# Patient Record
Sex: Female | Born: 1995 | Race: Black or African American | Hispanic: No | Marital: Married | Smoking: Never smoker
Health system: Southern US, Community
[De-identification: ages and names within clinical notes are randomized; demographics above are authoritative.]

## PROBLEM LIST (undated history)

## (undated) ENCOUNTER — Emergency Department (HOSPITAL_COMMUNITY): Admission: EM | Payer: BC Managed Care – PPO | Source: Home / Self Care

## (undated) ENCOUNTER — Inpatient Hospital Stay (HOSPITAL_COMMUNITY): Payer: Self-pay

## (undated) DIAGNOSIS — J45909 Unspecified asthma, uncomplicated: Secondary | ICD-10-CM

## (undated) DIAGNOSIS — I10 Essential (primary) hypertension: Secondary | ICD-10-CM

## (undated) DIAGNOSIS — R87629 Unspecified abnormal cytological findings in specimens from vagina: Secondary | ICD-10-CM

## (undated) DIAGNOSIS — Z8619 Personal history of other infectious and parasitic diseases: Secondary | ICD-10-CM

## (undated) DIAGNOSIS — Z789 Other specified health status: Secondary | ICD-10-CM

## (undated) HISTORY — DX: Unspecified abnormal cytological findings in specimens from vagina: R87.629

## (undated) HISTORY — DX: Personal history of other infectious and parasitic diseases: Z86.19

## (undated) HISTORY — PX: NO PAST SURGERIES: SHX2092

---
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
## 2015-02-17 ENCOUNTER — Inpatient Hospital Stay (HOSPITAL_COMMUNITY)
Admission: AD | Admit: 2015-02-17 | Discharge: 2015-02-17 | Disposition: A | Discharge status: Home / Self Care | Payer: Medicaid Other | Source: Ambulatory Visit | Attending: Obstetrics & Gynecology | Admitting: Obstetrics & Gynecology

## 2015-02-17 ENCOUNTER — Encounter (HOSPITAL_COMMUNITY): Payer: Self-pay | Admitting: *Deleted

## 2015-02-17 DIAGNOSIS — O98819 Other maternal infectious and parasitic diseases complicating pregnancy, unspecified trimester: Secondary | ICD-10-CM

## 2015-02-17 DIAGNOSIS — O2342 Unspecified infection of urinary tract in pregnancy, second trimester: Secondary | ICD-10-CM | POA: Insufficient documentation

## 2015-02-17 DIAGNOSIS — Z3A17 17 weeks gestation of pregnancy: Secondary | ICD-10-CM | POA: Insufficient documentation

## 2015-02-17 DIAGNOSIS — O208 Other hemorrhage in early pregnancy: Secondary | ICD-10-CM | POA: Diagnosis present

## 2015-02-17 DIAGNOSIS — O23592 Infection of other part of genital tract in pregnancy, second trimester: Secondary | ICD-10-CM | POA: Insufficient documentation

## 2015-02-17 DIAGNOSIS — O0932 Supervision of pregnancy with insufficient antenatal care, second trimester: Secondary | ICD-10-CM

## 2015-02-17 DIAGNOSIS — B9689 Other specified bacterial agents as the cause of diseases classified elsewhere: Secondary | ICD-10-CM | POA: Diagnosis not present

## 2015-02-17 DIAGNOSIS — O2242 Hemorrhoids in pregnancy, second trimester: Secondary | ICD-10-CM | POA: Diagnosis not present

## 2015-02-17 DIAGNOSIS — N760 Acute vaginitis: Secondary | ICD-10-CM | POA: Diagnosis not present

## 2015-02-17 DIAGNOSIS — R8271 Bacteriuria: Secondary | ICD-10-CM

## 2015-02-17 DIAGNOSIS — A749 Chlamydial infection, unspecified: Secondary | ICD-10-CM

## 2015-02-17 LAB — URINALYSIS, ROUTINE W REFLEX MICROSCOPIC
Bilirubin Urine: NEGATIVE
Glucose, UA: NEGATIVE mg/dL
Hgb urine dipstick: NEGATIVE
Ketones, ur: NEGATIVE mg/dL
Nitrite: NEGATIVE
Protein, ur: NEGATIVE mg/dL
SPECIFIC GRAVITY, URINE: 1.015 (ref 1.005–1.030)
UROBILINOGEN UA: 1 mg/dL (ref 0.0–1.0)
pH: 8.5 — ABNORMAL HIGH (ref 5.0–8.0)

## 2015-02-17 LAB — WET PREP, GENITAL
Trich, Wet Prep: NONE SEEN
YEAST WET PREP: NONE SEEN

## 2015-02-17 LAB — URINE MICROSCOPIC-ADD ON

## 2015-02-17 MED ORDER — HYDROCORTISONE ACE-PRAMOXINE 1-1 % RE FOAM: 1.0000 | Freq: Two times a day (BID) | RECTAL | Status: DC

## 2015-02-17 MED ORDER — METRONIDAZOLE 500 MG PO TABS: 500.0000 mg | ORAL_TABLET | Freq: Two times a day (BID) | ORAL | Status: DC

## 2015-02-17 NOTE — MAU Provider Note (Signed)
History     CSN: [id number]  Arrival date and time: 02/17/15 4098   First Provider Initiated Contact with Patient 02/17/15 1859      Chief Complaint  Patient presents with  . Rectal Bleeding   HPI  Ms. Michelle Calhoun is a 19 y.o. G1P0 at [redacted]w[redacted]d here with report of rectal bleeding when having a bowel movement today.  Reports blood in toilet.  Also difficult to sit on bottom due to pain.  Last normal bowel movement two days ago.  Denies vaginal bleeding or abdominal pain.  Has not started prenatal care due to [facility] pending.    History reviewed. No pertinent past medical history.  History reviewed. No pertinent past surgical history.  History reviewed. No pertinent family history.  Social History  Substance Use Topics  . Smoking status: Never Smoker   . Smokeless tobacco: None  . Alcohol Use: No     Comment: denies 08/28/14    Allergies: No Known Allergies  Prescriptions prior to admission  Medication Sig Dispense Refill Last Dose  . diphenhydrAMINE (BENADRYL) 25 MG tablet Take 25 mg by mouth every 6 (six) hours as needed for allergies or sleep.   Past Week at Unknown time  . Prenatal Vit-Fe Fumarate-FA (PRENATAL MULTIVITAMIN) TABS tablet Take 1 tablet by mouth daily at 12 noon.   Past Week at Unknown time    Review of Systems  Constitutional: Negative for fever and chills.  Gastrointestinal: Positive for blood in stool. Negative for nausea, vomiting and abdominal pain.  Genitourinary: Negative for dysuria, urgency and frequency.  Musculoskeletal: Negative for back pain.  All other systems reviewed and are negative.  Physical Exam   Blood pressure 104/46, pulse 88, temperature 98.4 F (36.9 C), temperature source Oral, resp. rate 18, height 5\' 4"  (1.626 m), weight 128 lb 6.4 oz (58.242 kg), last menstrual period 10/19/2014.  Physical Exam  Constitutional: She is oriented to person, place, and time. She appears well-developed and well-nourished.  HENT:  Head:  Normocephalic.  Neck: Normal range of motion. Neck supple.  Cardiovascular: Normal rate, regular rhythm and normal heart sounds.   Respiratory: Effort normal and breath sounds normal. No respiratory distress.  GI: Soft. There is no tenderness.  Fundal height 17  Genitourinary: Rectal exam shows internal hemorrhoid. Rectal exam shows no external hemorrhoid. Cervix exhibits discharge (yellowish discharge in cervical os). Cervix exhibits no motion tenderness. No bleeding in the vagina. Vaginal discharge (mucusy) found.  Musculoskeletal: Normal range of motion. She exhibits no edema.  Neurological: She is alert and oriented to person, place, and time.  Skin: Skin is warm and dry.    MAU Course  Procedures Results for orders placed or performed during the hospital encounter of 02/17/15 (from the past 24 hour(s))  Urinalysis, Routine w reflex microscopic (not at [facility])     Status: Abnormal   Collection Time: 02/17/15  5:53 PM  Result Value Ref Range   Color, Urine YELLOW YELLOW   APPearance CLEAR CLEAR   Specific Gravity, Urine 1.015 1.005 - 1.030   pH 8.5 (H) 5.0 - 8.0   Glucose, UA NEGATIVE NEGATIVE mg/dL   Hgb urine dipstick NEGATIVE NEGATIVE   Bilirubin Urine NEGATIVE NEGATIVE   Ketones, ur NEGATIVE NEGATIVE mg/dL   Protein, ur NEGATIVE NEGATIVE mg/dL   Urobilinogen, UA 1.0 0.0 - 1.0 mg/dL   Nitrite NEGATIVE NEGATIVE   Leukocytes, UA MODERATE (A) NEGATIVE  Urine microscopic-add on     Status: Abnormal   Collection Time: 02/17/15  5:53 PM  Result Value Ref Range   Squamous Epithelial / LPF FEW (A) RARE   WBC, UA 3-6 <3 WBC/hpf  Wet prep, genital     Status: Abnormal   Collection Time: 02/17/15  7:10 PM  Result Value Ref Range   Yeast Wet Prep HPF POC NONE SEEN NONE SEEN   Trich, Wet Prep NONE SEEN NONE SEEN   Clue Cells Wet Prep HPF POC FEW (A) NONE SEEN   WBC, Wet Prep HPF POC FEW (A) NONE SEEN    Assessment and Plan  19 y.o. G1P0 at [redacted]w[redacted]d IUP Bacterial Vaginosis Bacteria  in Urine Hemorrhoids No prenatal care  Plan: Discharge to home RX Flagyl 500 mg BID x 7 days GC/CT pending Urine culture to lab RX proctofoam Outpatient anatomy ultrasound ordered Message routed to [facility] clinic for new OB appt  Marlis Edelson 02/17/2015, 8:15 PM

## 2015-02-17 NOTE — MAU Note (Signed)
Pt stated she had a BM today and then saw a lot of blood when she wiped. Did strain to have the BM

## 2015-02-18 LAB — GC/CHLAMYDIA PROBE AMP (~~LOC~~) NOT AT ARMC
Chlamydia: POSITIVE — AB
Neisseria Gonorrhea: NEGATIVE

## 2015-02-19 LAB — URINE CULTURE

## 2015-02-21 ENCOUNTER — Telehealth (HOSPITAL_COMMUNITY): Payer: Self-pay | Admitting: *Deleted

## 2015-02-21 DIAGNOSIS — A749 Chlamydial infection, unspecified: Secondary | ICD-10-CM

## 2015-02-21 DIAGNOSIS — O98812 Other maternal infectious and parasitic diseases complicating pregnancy, second trimester: Principal | ICD-10-CM

## 2015-02-21 MED ORDER — AZITHROMYCIN 500 MG PO TABS: ORAL_TABLET | ORAL | Status: DC

## 2015-02-21 NOTE — Telephone Encounter (Signed)
Telephone call to patient regarding positive chlamydia culture, patient notified.  Rx routed to pharmacy per protocol.  Instructed patient to notify her partner for treatment and to abstain from sex for seven days post treatment.  Report faxed to health department.  

## 2015-03-01 ENCOUNTER — Encounter: Payer: Self-pay | Admitting: Advanced Practice Midwife

## 2015-03-01 ENCOUNTER — Encounter: Payer: Medicaid Other | Admitting: Advanced Practice Midwife

## 2015-03-14 LAB — OB RESULTS CONSOLE ANTIBODY SCREEN: ANTIBODY SCREEN: NEGATIVE

## 2015-03-14 LAB — OB RESULTS CONSOLE GC/CHLAMYDIA
CHLAMYDIA, DNA PROBE: POSITIVE
GC PROBE AMP, GENITAL: NEGATIVE

## 2015-03-14 LAB — OB RESULTS CONSOLE RUBELLA ANTIBODY, IGM: Rubella: IMMUNE

## 2015-03-14 LAB — OB RESULTS CONSOLE HEPATITIS B SURFACE ANTIGEN: HEP B S AG: NEGATIVE

## 2015-03-14 LAB — OB RESULTS CONSOLE HIV ANTIBODY (ROUTINE TESTING): HIV: NONREACTIVE

## 2015-03-14 LAB — OB RESULTS CONSOLE ABO/RH: RH Type: POSITIVE

## 2015-03-14 LAB — OB RESULTS CONSOLE RPR: RPR: NONREACTIVE

## 2015-03-21 ENCOUNTER — Other Ambulatory Visit (HOSPITAL_COMMUNITY): Payer: Self-pay | Admitting: Family

## 2015-03-21 ENCOUNTER — Ambulatory Visit (HOSPITAL_COMMUNITY)
Admission: RE | Admit: 2015-03-21 | Discharge: 2015-03-21 | Disposition: A | Discharge status: Home / Self Care | Payer: Medicaid Other | Source: Ambulatory Visit | Attending: Nurse Practitioner | Admitting: Nurse Practitioner

## 2015-03-21 DIAGNOSIS — O0932 Supervision of pregnancy with insufficient antenatal care, second trimester: Secondary | ICD-10-CM

## 2015-03-21 DIAGNOSIS — Z3A20 20 weeks gestation of pregnancy: Secondary | ICD-10-CM | POA: Insufficient documentation

## 2015-03-21 DIAGNOSIS — Z3A21 21 weeks gestation of pregnancy: Secondary | ICD-10-CM

## 2015-03-21 DIAGNOSIS — Z3689 Encounter for other specified antenatal screening: Secondary | ICD-10-CM

## 2015-03-21 DIAGNOSIS — Z36 Encounter for antenatal screening of mother: Secondary | ICD-10-CM | POA: Diagnosis present

## 2015-03-28 ENCOUNTER — Encounter (HOSPITAL_COMMUNITY): Payer: Self-pay

## 2015-03-28 ENCOUNTER — Other Ambulatory Visit (HOSPITAL_COMMUNITY): Payer: Self-pay | Admitting: Nurse Practitioner

## 2015-03-28 ENCOUNTER — Ambulatory Visit (HOSPITAL_COMMUNITY)
Admission: RE | Admit: 2015-03-28 | Discharge: 2015-03-28 | Disposition: A | Discharge status: Home / Self Care | Payer: Medicaid Other | Source: Ambulatory Visit | Attending: Nurse Practitioner | Admitting: Nurse Practitioner

## 2015-03-28 DIAGNOSIS — O281 Abnormal biochemical finding on antenatal screening of mother: Secondary | ICD-10-CM

## 2015-03-28 DIAGNOSIS — A749 Chlamydial infection, unspecified: Secondary | ICD-10-CM

## 2015-03-28 DIAGNOSIS — O280 Abnormal hematological finding on antenatal screening of mother: Secondary | ICD-10-CM | POA: Insufficient documentation

## 2015-03-28 DIAGNOSIS — O98819 Other maternal infectious and parasitic diseases complicating pregnancy, unspecified trimester: Secondary | ICD-10-CM

## 2015-03-28 NOTE — Progress Notes (Signed)
Genetic Counseling  High-Risk Gestation Note  Appointment Date:  03/28/2015 Referred By: [name], NP Date of Birth:  May 29, 1996 Partner:  [name]    Pregnancy History: G1P0 Estimated Date of Delivery: 08/08/15 Estimated Gestational Age: [redacted]w[redacted]d Attending: [name], MD    Michelle Calhoun and her partner, Mr. [name], were seen for genetic counseling because of an increased risk for fetal Down syndrome based on Quad screen through [facility].  In Summary:  1 in 8 Down syndrome risk from Quad  Targeted ultrasound performed 03/21/15, within normal limits. Reviewed limitations of ultrasound  Couple declined NIPS and amniocentesis  Follow-up ultrasound scheduled for 05/09/15 for fetal growth assessment, given elevated DIA on Quad screen  Family history significant for maternal great aunt with sickle cell disease. See detailed discussion below.   They were counseled regarding the Quad screen result and the associated 1 in 8 risk for fetal Down syndrome.  We reviewed chromosomes, nondisjunction, and the common features and variable prognosis of Down syndrome.  In addition, we reviewed the screen negative risks for trisomy 18 and ONTDs.  We also discussed other explanations for a screen positive result including: a gestational dating error, differences in maternal metabolism, and normal variation. They understand that this screening is not diagnostic for Down syndrome but provides a risk assessment. Additionally, we specifically discussed that the level of one of the proteins analyzed on the screen, DIA, was very high (2.22 MoM).  This has been associated with an increased risk for growth restriction or poor pregnancy outcome later in pregnancy; therefore, we would recommend a follow up ultrasound for fetal growth in the third trimester.  We reviewed available screening options including noninvasive prenatal screening (NIPS)/cell  free DNA (cfDNA) testing and detailed ultrasound.  We reviewed that ultrasound performed in [facility] Radiology department on 03/21/15 was within normal range. Complete ultrasound results were reported under separate cover. They were counseled that screening tests are used to modify a patient's a priori risk for aneuploidy, typically based on age. This estimate provides a pregnancy specific risk assessment. We reviewed the benefits and limitations of each option. Specifically, we discussed the conditions for which each test screens, the detection rates, and false positive rates of each. They were also counseled regarding diagnostic testing via CVS/amniocentesis. We reviewed the approximate 1 in 300-500 risk for complications for amniocentesis, including spontaneous pregnancy loss.   After consideration of all the options, they declined NIPS and amniocentesis, stating that knowing about the presence of a chromosome condition prior to delivery would likely cause additional stress. They understand that screening tests cannot rule out all birth defects or genetic syndromes. The patient was advised of this limitation and states she still does not want additional testing at this time.   Both family histories were reviewed and found to be contributory for a maternal great-aunt to the patient who had sickle cell anemia. No additional relatives were known with sickle cell anemia or sickle cell trait. Michelle Calhoun was provided with written information regarding sickle cell anemia (SCA) including the carrier frequency and incidence in the African-American population, the availability of carrier testing and prenatal diagnosis if indicated.  In addition, we discussed that hemoglobinopathies are routinely screened for as part of the [place] newborn screening panel. Given this reported family history, Ms. ,Cueva's chance to have sickle cell trait, prior to carrier screening, would be approximately 1 in 6.  We  discussed that available OB  medical records indicate that hemoglobin electrophoresis was drawn for the patient on 03/14/15, and that results are not available to [place] at the time of today's visit. Without further information regarding the provided family history, an accurate genetic risk cannot be calculated. Further genetic counseling is warranted if more information is obtained.  Michelle Calhoun denied exposure to environmental toxins or chemical agents. She denied the use of alcohol, tobacco or street drugs. She denied significant viral illnesses during the course of her pregnancy. Her medical and surgical histories were noncontributory.   I counseled this couple for approximately 40 minutes regarding the above risks and available options.   [name], MS,  Certified Genetic Counselor 03/28/2015

## 2015-05-09 ENCOUNTER — Ambulatory Visit (HOSPITAL_COMMUNITY)
Admission: RE | Admit: 2015-05-09 | Discharge: 2015-05-09 | Disposition: A | Discharge status: Home / Self Care | Payer: Medicaid Other | Source: Ambulatory Visit | Attending: Nurse Practitioner | Admitting: Nurse Practitioner

## 2015-05-09 ENCOUNTER — Encounter (HOSPITAL_COMMUNITY): Payer: Self-pay

## 2015-05-09 ENCOUNTER — Other Ambulatory Visit (HOSPITAL_COMMUNITY): Payer: Self-pay | Admitting: Maternal and Fetal Medicine

## 2015-05-09 DIAGNOSIS — O283 Abnormal ultrasonic finding on antenatal screening of mother: Secondary | ICD-10-CM | POA: Diagnosis not present

## 2015-05-09 DIAGNOSIS — IMO0002 Reserved for concepts with insufficient information to code with codable children: Secondary | ICD-10-CM

## 2015-05-09 DIAGNOSIS — O281 Abnormal biochemical finding on antenatal screening of mother: Secondary | ICD-10-CM

## 2015-05-09 DIAGNOSIS — Z3A27 27 weeks gestation of pregnancy: Secondary | ICD-10-CM

## 2015-05-09 DIAGNOSIS — O0932 Supervision of pregnancy with insufficient antenatal care, second trimester: Secondary | ICD-10-CM

## 2015-05-20 ENCOUNTER — Encounter (HOSPITAL_COMMUNITY): Payer: Self-pay

## 2015-05-20 ENCOUNTER — Inpatient Hospital Stay (HOSPITAL_COMMUNITY)
Admission: AD | Admit: 2015-05-20 | Discharge: 2015-05-20 | Disposition: A | Discharge status: Home / Self Care | Payer: Medicaid Other | Source: Ambulatory Visit | Attending: Family Medicine | Admitting: Family Medicine

## 2015-05-20 DIAGNOSIS — O99613 Diseases of the digestive system complicating pregnancy, third trimester: Secondary | ICD-10-CM | POA: Insufficient documentation

## 2015-05-20 DIAGNOSIS — O26893 Other specified pregnancy related conditions, third trimester: Secondary | ICD-10-CM | POA: Diagnosis not present

## 2015-05-20 DIAGNOSIS — R109 Unspecified abdominal pain: Secondary | ICD-10-CM | POA: Diagnosis present

## 2015-05-20 DIAGNOSIS — Z3A28 28 weeks gestation of pregnancy: Secondary | ICD-10-CM | POA: Insufficient documentation

## 2015-05-20 DIAGNOSIS — K219 Gastro-esophageal reflux disease without esophagitis: Secondary | ICD-10-CM | POA: Diagnosis not present

## 2015-05-20 HISTORY — DX: Other specified health status: Z78.9

## 2015-05-20 HISTORY — DX: Unspecified asthma, uncomplicated: J45.909

## 2015-05-20 MED ORDER — DOCUSATE SODIUM 100 MG PO CAPS: 100.0000 mg | ORAL_CAPSULE | Freq: Two times a day (BID) | ORAL | Status: DC

## 2015-05-20 MED ORDER — GI COCKTAIL ~~LOC~~
30.0000 mL | Freq: Once | ORAL | Status: AC
  Administered 2015-05-20: 30 mL via ORAL
  Filled 2015-05-20: qty 30

## 2015-05-20 MED ORDER — OMEPRAZOLE 20 MG PO CPDR: 20.0000 mg | DELAYED_RELEASE_CAPSULE | Freq: Every day | ORAL | Status: DC

## 2015-05-20 NOTE — MAU Provider Note (Signed)
First Provider Initiated Contact with Patient 05/20/15 [phone]      Chief Complaint:  Heartburn and Abdominal Pain   Michelle Calhoun is  19 y.o. G1P0 at [redacted]w[redacted]d presents complaining of Heartburn and Abdominal Pain .  She states none contractions are associated with none vaginal bleeding, intact membranes, along with active fetal movement.  Pt. Is here with acid reflux that has been going on since eating meatballs at lunch today. She says that she had one episode of vomiting earlier today that consisted of her meatballs. She denies flank pain, dysuria, hematuria, fever, or chills. She has not had vision changes, or headaches. She has no abdominal pain. She denies vaginal bleeding, or leaking fluids. She says she has a "couple of contractions" per night. She says the baby is moving very well. She has not been taking the acid reflux medicine recommended at her previous ED visit. She is followed at [facility] for her pregnancy and has a visit next week. She is also being followed by MFM for increased risk of Trisomy 21 by Quad screen.    Obstetrical/Gynecological History: OB History    Gravida Para Term Preterm AB TAB SAB Ectopic Multiple Living   1              Past Medical History: Past Medical History  Diagnosis Date  . Asthma   . Medical history non-contributory     Past Surgical History: Past Surgical History  Procedure Laterality Date  . No past surgeries      Family History: No family history on file.  Social History: Social History  Substance Use Topics  . Smoking status: Never Smoker   . Smokeless tobacco: None  . Alcohol Use: No     Comment: denies 08/28/14    Allergies: No Known Allergies  Meds:  Prescriptions prior to admission  Medication Sig Dispense Refill Last Dose  . azithromycin (ZITHROMAX) 500 MG tablet Take two tablets by mouth once (Patient not taking: Reported on 05/09/2015) 2 tablet 0 Not Taking  . diphenhydrAMINE (BENADRYL) 25 MG tablet Take 25 mg by mouth  every 6 (six) hours as needed for allergies or sleep.   Taking  . hydrocortisone-pramoxine (PROCTOFOAM HC) rectal foam Place 1 applicator rectally 2 (two) times daily. (Patient not taking: Reported on 05/09/2015) 10 g 0 Not Taking  . metroNIDAZOLE (FLAGYL) 500 MG tablet Take 1 tablet (500 mg total) by mouth 2 (two) times daily. (Patient not taking: Reported on 05/09/2015) 14 tablet 0 Not Taking  . Prenatal Vit-Fe Fumarate-FA (PRENATAL MULTIVITAMIN) TABS tablet Take 1 tablet by mouth daily at 12 noon.   Taking    Review of Systems -   Review of Systems  Constitutional: Negative for fever, chills, weight loss, malaise/fatigue and diaphoresis.  HENT: Negative for hearing loss, ear pain, nosebleeds, congestion, sore throat, neck pain, tinnitus and ear discharge.   Eyes: Negative for blurred vision, double vision, photophobia, pain, discharge and redness.  Respiratory: Negative for cough, hemoptysis, sputum production, shortness of breath, wheezing and stridor.   Cardiovascular: Negative for chest pain, palpitations, orthopnea,  leg swelling  Gastrointestinal: Negative for abdominal pain heartburn, nausea, vomiting, diarrhea, constipation, blood in stool Genitourinary: Negative for dysuria, urgency, frequency, hematuria and flank pain.  Musculoskeletal: Negative for myalgias, back pain, joint pain and falls.  Skin: Negative for itching and rash.  Neurological: Negative for dizziness, tingling, tremors, sensory change, speech change, focal weakness, seizures, loss of consciousness, weakness and headaches.  Endo/Heme/Allergies: Negative for environmental allergies and  polydipsia. Does not bruise/bleed easily.  Psychiatric/Behavioral: Negative for depression, suicidal ideas, hallucinations, memory loss and substance abuse. The patient is not nervous/anxious and does not have insomnia.      Physical Exam  Blood pressure 114/60, pulse 85, temperature 97.7 F (36.5 C), temperature source Oral, resp.  rate 18, height 5\' 4"  (1.626 m), weight 63.05 kg (139 lb), last menstrual period 10/19/2014, SpO2 99 %. GENERAL: Well-developed, well-nourished female in no acute distress.  LUNGS: Clear to auscultation bilaterally.  HEART: Regular rate and rhythm. ABDOMEN: Soft, nontender, nondistended, gravid.   EXTREMITIES: Nontender, no edema, 2+ distal pulses. DTR's 2+ CERVICAL EXAM: Not performed.  FHT:  Baseline rate 130 bpm   Variability moderate  Accelerations present   Decelerations none    Labs: No results found for this or any previous visit (from the past 24 hour(s)). Imaging Studies:  [place] [facility] Ob Follow Up  05/09/2015  OBSTETRICAL ULTRASOUND: This exam was performed within a [facility]. The OB US report was generated in the AS system, and faxed to the ordering physician.  This report is available in the [facility] PACS. See the [facility] Obstetric US report via the Image Link.   Assessment: Michelle Calhoun is  19 y.o. G1P0 at 727w4d presents with GERD precipitating and episode of vomiting. Afebrile, no other warning signs or symptoms. Uncontrolled Acid reflux at this time.   Plan: - GI cocktail.  - Recommend ppi upon discharge.  - Following up with her OB provider next week.  - MFM follow up as previously scheduled.  - Return precautions reviewed.   Caleb Melancon 11/18/20161:14 AM  I was present for the exam and agree with above. PTL precautions reviewed. GERD diet discussed.   [place] Karlynn Furrow, CNM 05/20/2015 2:06 AM

## 2015-05-20 NOTE — MAU Note (Signed)
Pt here with c/o heartburn all day today and abdominal pain for last couple of weeks; positive fetal movement reported. Denies any bleeding or leaking of fluid. Has had vomiting today and stools loose, "which is not normal for me--usually hard for me to go"

## 2015-05-20 NOTE — Discharge Instructions (Signed)
Food Choices for Gastroesophageal Reflux Disease, Adult °When you have gastroesophageal reflux disease (GERD), the foods you eat and your eating habits are very important. Choosing the right foods can help ease the discomfort of GERD. °WHAT GENERAL GUIDELINES DO I NEED TO FOLLOW? °· Choose fruits, vegetables, whole grains, low-fat dairy products, and low-fat meat, fish, and poultry. °· Limit fats such as oils, salad dressings, butter, nuts, and avocado. °· Keep a food diary to identify foods that cause symptoms. °· Avoid foods that cause reflux. These may be different for different people. °· Eat frequent small meals instead of three large meals each day. °· Eat your meals slowly, in a relaxed setting. °· Limit fried foods. °· Cook foods using methods other than frying. °· Avoid drinking alcohol. °· Avoid drinking large amounts of liquids with your meals. °· Avoid bending over or lying down until 2-3 hours after eating. °WHAT FOODS ARE NOT RECOMMENDED? °The following are some foods and drinks that may worsen your symptoms: °Vegetables °Tomatoes. Tomato juice. Tomato and spaghetti sauce. Chili peppers. Onion and garlic. Horseradish. °Fruits °Oranges, grapefruit, and lemon (fruit and juice). °Meats °High-fat meats, fish, and poultry. This includes hot dogs, ribs, ham, sausage, salami, and bacon. °Dairy °Whole milk and chocolate milk. Sour cream. Cream. Butter. Ice cream. Cream cheese.  °Beverages °Coffee and tea, with or without caffeine. Carbonated beverages or energy drinks. °Condiments °Hot sauce. Barbecue sauce.  °Sweets/Desserts °Chocolate and cocoa. Donuts. Peppermint and spearmint. °Fats and Oils °High-fat foods, including French fries and potato chips. °Other °Vinegar. Strong spices, such as black pepper, white pepper, red pepper, cayenne, curry powder, cloves, ginger, and chili powder. °The items listed above may not be a complete list of foods and beverages to avoid. Contact your dietitian for more  information. °  °This information is not intended to replace advice given to you by your health care provider. Make sure you discuss any questions you have with your health care provider. °  °Document Released: 06/18/2005 Document Revised: 07/09/2014 Document Reviewed: 04/22/2013 °Elsevier Interactive Patient Education ©2016 Elsevier Inc. ° °Gastroesophageal Reflux Disease, Adult °Normally, food travels down the esophagus and stays in the stomach to be digested. However, when a person has gastroesophageal reflux disease (GERD), food and stomach acid move back up into the esophagus. When this happens, the esophagus becomes sore and inflamed. Over time, GERD can create small holes (ulcers) in the lining of the esophagus.  °CAUSES °This condition is caused by a problem with the muscle between the esophagus and the stomach (lower esophageal sphincter, or LES). Normally, the LES muscle closes after food passes through the esophagus to the stomach. When the LES is weakened or abnormal, it does not close properly, and that allows food and stomach acid to go back up into the esophagus. The LES can be weakened by certain dietary substances, medicines, and medical conditions, including: °· Tobacco use. °· Pregnancy. °· Having a hiatal hernia. °· Heavy alcohol use. °· Certain foods and beverages, such as coffee, chocolate, onions, and peppermint. °RISK FACTORS °This condition is more likely to develop in: °· People who have an increased body weight. °· People who have connective tissue disorders. °· People who use NSAID medicines. °SYMPTOMS °Symptoms of this condition include: °· Heartburn. °· Difficult or painful swallowing. °· The feeling of having a lump in the throat. °· A bitter taste in the mouth. °· Bad breath. °· Having a large amount of saliva. °· Having an upset or bloated stomach. °· Belching. °· Chest pain. °·   Shortness of breath or wheezing. °· Ongoing (chronic) cough or a night-time cough. °· Wearing away of  tooth enamel. °· Weight loss. °Different conditions can cause chest pain. Make sure to see your health care provider if you experience chest pain. °DIAGNOSIS °Your health care provider will take a medical history and perform a physical exam. To determine if you have mild or severe GERD, your health care provider may also monitor how you respond to treatment. You may also have other tests, including: °· An endoscopy to examine your stomach and esophagus with a small camera. °· A test that measures the acidity level in your esophagus. °· A test that measures how much pressure is on your esophagus. °· A barium swallow or modified barium swallow to show the shape, size, and functioning of your esophagus. °TREATMENT °The goal of treatment is to help relieve your symptoms and to prevent complications. Treatment for this condition may vary depending on how severe your symptoms are. Your health care provider may recommend: °· Changes to your diet. °· Medicine. °· Surgery. °HOME CARE INSTRUCTIONS °Diet °· Follow a diet as recommended by your health care provider. This may involve avoiding foods and drinks such as: °¨ Coffee and tea (with or without caffeine). °¨ Drinks that contain alcohol. °¨ Energy drinks and sports drinks. °¨ Carbonated drinks or sodas. °¨ Chocolate and cocoa. °¨ Peppermint and mint flavorings. °¨ Garlic and onions. °¨ Horseradish. °¨ Spicy and acidic foods, including peppers, chili powder, curry powder, vinegar, hot sauces, and barbecue sauce. °¨ Citrus fruit juices and citrus fruits, such as oranges, lemons, and limes. °¨ Tomato-based foods, such as red sauce, chili, salsa, and pizza with red sauce. °¨ Fried and fatty foods, such as donuts, french fries, potato chips, and high-fat dressings. °¨ High-fat meats, such as hot dogs and fatty cuts of red and white meats, such as rib eye steak, sausage, ham, and bacon. °¨ High-fat dairy items, such as whole milk, butter, and cream cheese. °· Eat small,  frequent meals instead of large meals. °· Avoid drinking large amounts of liquid with your meals. °· Avoid eating meals during the 2-3 hours before bedtime. °· Avoid lying down right after you eat. °· Do not exercise right after you eat. ° General Instructions  °· Pay attention to any changes in your symptoms. °· Take over-the-counter and prescription medicines only as told by your health care provider. Do not take aspirin, ibuprofen, or other NSAIDs unless your health care provider told you to do so. °· Do not use any tobacco products, including cigarettes, chewing tobacco, and e-cigarettes. If you need help quitting, ask your health care provider. °· Wear loose-fitting clothing. Do not wear anything tight around your waist that causes pressure on your abdomen. °· Raise (elevate) the head of your bed 6 inches (15cm). °· Try to reduce your stress, such as with yoga or meditation. If you need help reducing stress, ask your health care provider. °· If you are overweight, reduce your weight to an amount that is healthy for you. Ask your health care provider for guidance about a safe weight loss goal. °· Keep all follow-up visits as told by your health care provider. This is important. °SEEK MEDICAL CARE IF: °· You have new symptoms. °· You have unexplained weight loss. °· You have difficulty swallowing, or it hurts to swallow. °· You have wheezing or a persistent cough. °· Your symptoms do not improve with treatment. °· You have a hoarse voice. °SEEK IMMEDIATE MEDICAL CARE IF: °· You have pain   in your arms, neck, jaw, teeth, or back. °· You feel sweaty, dizzy, or light-headed. °· You have chest pain or shortness of breath. °· You vomit and your vomit looks like blood or coffee grounds. °· You faint. °· Your stool is bloody or black. °· You cannot swallow, drink, or eat. °  °This information is not intended to replace advice given to you by your health care provider. Make sure you discuss any questions you have with  your health care provider. °  °Document Released: 03/28/2005 Document Revised: 03/09/2015 Document Reviewed: 10/13/2014 °Elsevier Interactive Patient Education ©2016 Elsevier Inc. ° °

## 2015-06-06 ENCOUNTER — Ambulatory Visit (HOSPITAL_COMMUNITY)
Admission: RE | Admit: 2015-06-06 | Discharge: 2015-06-06 | Disposition: A | Discharge status: Home / Self Care | Payer: Medicaid Other | Source: Ambulatory Visit | Attending: Advanced Practice Midwife | Admitting: Advanced Practice Midwife

## 2015-06-06 ENCOUNTER — Encounter (HOSPITAL_COMMUNITY): Payer: Self-pay

## 2015-06-06 ENCOUNTER — Other Ambulatory Visit (HOSPITAL_COMMUNITY): Payer: Self-pay | Admitting: Obstetrics and Gynecology

## 2015-06-06 VITALS — BP 120/66 | HR 82 | Wt 138.8 lb

## 2015-06-06 DIAGNOSIS — O0933 Supervision of pregnancy with insufficient antenatal care, third trimester: Secondary | ICD-10-CM | POA: Insufficient documentation

## 2015-06-06 DIAGNOSIS — Z3A31 31 weeks gestation of pregnancy: Secondary | ICD-10-CM

## 2015-06-06 DIAGNOSIS — O283 Abnormal ultrasonic finding on antenatal screening of mother: Secondary | ICD-10-CM | POA: Insufficient documentation

## 2015-06-06 DIAGNOSIS — O280 Abnormal hematological finding on antenatal screening of mother: Secondary | ICD-10-CM

## 2015-06-06 DIAGNOSIS — O281 Abnormal biochemical finding on antenatal screening of mother: Secondary | ICD-10-CM

## 2015-06-20 ENCOUNTER — Encounter (HOSPITAL_COMMUNITY): Payer: Self-pay | Admitting: *Deleted

## 2015-06-20 ENCOUNTER — Inpatient Hospital Stay (HOSPITAL_COMMUNITY)
Admission: AD | Admit: 2015-06-20 | Discharge: 2015-06-20 | Disposition: A | Discharge status: Home / Self Care | Payer: Medicaid Other | Source: Ambulatory Visit | Attending: Obstetrics & Gynecology | Admitting: Obstetrics & Gynecology

## 2015-06-20 DIAGNOSIS — O26893 Other specified pregnancy related conditions, third trimester: Secondary | ICD-10-CM | POA: Insufficient documentation

## 2015-06-20 DIAGNOSIS — O280 Abnormal hematological finding on antenatal screening of mother: Secondary | ICD-10-CM

## 2015-06-20 DIAGNOSIS — R109 Unspecified abdominal pain: Secondary | ICD-10-CM | POA: Diagnosis present

## 2015-06-20 DIAGNOSIS — R102 Pelvic and perineal pain: Secondary | ICD-10-CM | POA: Insufficient documentation

## 2015-06-20 DIAGNOSIS — O98819 Other maternal infectious and parasitic diseases complicating pregnancy, unspecified trimester: Secondary | ICD-10-CM

## 2015-06-20 DIAGNOSIS — Z3A33 33 weeks gestation of pregnancy: Secondary | ICD-10-CM | POA: Diagnosis not present

## 2015-06-20 DIAGNOSIS — A749 Chlamydial infection, unspecified: Secondary | ICD-10-CM

## 2015-06-20 LAB — URINALYSIS, ROUTINE W REFLEX MICROSCOPIC
Bilirubin Urine: NEGATIVE
Glucose, UA: NEGATIVE mg/dL
Hgb urine dipstick: NEGATIVE
KETONES UR: NEGATIVE mg/dL
NITRITE: NEGATIVE
PROTEIN: NEGATIVE mg/dL
Specific Gravity, Urine: 1.01 (ref 1.005–1.030)
pH: 7 (ref 5.0–8.0)

## 2015-06-20 LAB — URINE MICROSCOPIC-ADD ON: RBC / HPF: NONE SEEN RBC/hpf (ref 0–5)

## 2015-06-20 NOTE — MAU Note (Signed)
Pt reports pelvic pressure and abd pain x 4 days, worsening tonight. Denies bleeding or ROM

## 2015-06-20 NOTE — MAU Provider Note (Signed)
  History     CSN: [id number]  Arrival date and time: 06/20/15 0240   None     No chief complaint on file.  HPI  19 yo G1 at 33.0 weeks presents to the MAU for pelvic pressure and abdominal pain. Her pregnancy has been complicated by an increased risk for Down's Syndrome, for which she is followed by MFM. She is also late to prenatal care. Per patient, she has had bilateral groin pain for the past 4 days that is constant and worse when she rolls over in bed and is walking. She has tried half tab of tylenol, but this hasn't helped. She denies any contractions, VB, LOF, or decreased FM. She also denies fever, decreased appetite, nausea, vomiting, and constipation. She has had increased pelvic pressure as well. No change in vaginal discharge.     Past Medical History  Diagnosis Date  . Asthma   . Medical history non-contributory     Past Surgical History  Procedure Laterality Date  . No past surgeries      History reviewed. No pertinent family history.  Social History  Substance Use Topics  . Smoking status: Never Smoker   . Smokeless tobacco: None  . Alcohol Use: No     Comment: denies 08/28/14    Allergies: No Known Allergies  Prescriptions prior to admission  Medication Sig Dispense Refill Last Dose  . diphenhydrAMINE (BENADRYL) 25 MG tablet Take 25 mg by mouth every 6 (six) hours as needed for allergies or sleep.   Past Month at Unknown time  . docusate sodium (COLACE) 100 MG capsule Take 1 capsule (100 mg total) by mouth 2 (two) times daily. 20 capsule 0 06/19/2015 at Unknown time  . omeprazole (PRILOSEC) 20 MG capsule Take 1 capsule (20 mg total) by mouth daily. 30 capsule 0 06/19/2015 at Unknown time  . Prenatal Vit-Fe Fumarate-FA (PRENATAL MULTIVITAMIN) TABS tablet Take 1 tablet by mouth daily at 12 noon.   06/19/2015 at Unknown time  . terconazole (TERAZOL 7) 0.4 % vaginal cream Place 1 applicator vaginally at bedtime.   06/19/2015 at Unknown time  .  hydrocortisone-pramoxine (PROCTOFOAM HC) rectal foam Place 1 applicator rectally 2 (two) times daily. (Patient not taking: Reported on 05/09/2015) 10 g 0 Unknown at Unknown time    ROS Physical Exam   Blood pressure 120/70, pulse 84, temperature 97.6 F (36.4 C), temperature source Oral, resp. rate 16, height 5\' 4"  (1.626 m), weight 64.864 kg (143 lb), last menstrual period 10/19/2014, SpO2 100 %.  Physical Exam  Gen: Alert, cooperative, no acute distress Lungs: CTAB Cardiac: RRR, no murmur Abd: Soft, nontender, +BS, gravid.  Extremities: Non edematous Pelvic: No cervical motion tenderness; closed/long/high  FHTs: Cat 1, reactive, baseline 150 No contractions noted  MAU Course  Procedures   Assessment and Plan  19 yo G1 at 33.0w presents to MAU for pelvic pressure and abdominal pain. Rare contractions on the monitor, no VB or LOF, cervix is closed, so patient is not in labor. No fever or vaginal discharge, so doubt STD or vaginitis. Most likely round ligament pain. Recommend pelvic girdle and full dose tylenol. Patient to f/u with OB this week.   Michelle Calhoun 06/20/2015, 3:12 AM

## 2015-07-03 NOTE — L&D Delivery Note (Signed)
Delivery Note At 3:09 PM a viable female was delivered via  (Presentation: occiuput anterior).  APGAR: 2, 6, 8; weight pending.   Placenta status: delivered, intact.  Cord: 3vc. with the following complications: Code Apgar.  Cord pH: 7.10  Anesthesia: Epidural  Episiotomy:  none Lacerations:  1st degree perineal, left labial Suture Repair: 3.0 vicryl Est. Blood Loss (mL):  150  Mom to postpartum.  Baby to NICU.  STINSON, JACOB JEHIEL 08/15/2015, 3:30 PM  After patient brought up to NICU, baby's breathing improved and baby returned to mother.  Levie Heritage, DO 08/15/2015 5:19 PM

## 2015-07-11 ENCOUNTER — Ambulatory Visit (HOSPITAL_COMMUNITY): Payer: Medicaid Other

## 2015-07-11 LAB — OB RESULTS CONSOLE GC/CHLAMYDIA
CHLAMYDIA, DNA PROBE: NEGATIVE
GC PROBE AMP, GENITAL: NEGATIVE

## 2015-07-11 LAB — OB RESULTS CONSOLE GBS: STREP GROUP B AG: POSITIVE

## 2015-07-13 ENCOUNTER — Ambulatory Visit (HOSPITAL_COMMUNITY)
Admission: RE | Admit: 2015-07-13 | Discharge: 2015-07-13 | Disposition: A | Discharge status: Home / Self Care | Payer: Medicaid Other | Source: Ambulatory Visit | Attending: Maternal and Fetal Medicine | Admitting: Maternal and Fetal Medicine

## 2015-07-13 ENCOUNTER — Other Ambulatory Visit (HOSPITAL_COMMUNITY): Payer: Self-pay | Admitting: Maternal and Fetal Medicine

## 2015-07-13 DIAGNOSIS — O280 Abnormal hematological finding on antenatal screening of mother: Secondary | ICD-10-CM

## 2015-07-13 DIAGNOSIS — IMO0002 Reserved for concepts with insufficient information to code with codable children: Secondary | ICD-10-CM

## 2015-07-13 DIAGNOSIS — Z3A36 36 weeks gestation of pregnancy: Secondary | ICD-10-CM

## 2015-07-13 DIAGNOSIS — O0933 Supervision of pregnancy with insufficient antenatal care, third trimester: Secondary | ICD-10-CM | POA: Diagnosis not present

## 2015-07-13 DIAGNOSIS — O283 Abnormal ultrasonic finding on antenatal screening of mother: Secondary | ICD-10-CM | POA: Insufficient documentation

## 2015-08-09 ENCOUNTER — Telehealth (HOSPITAL_COMMUNITY): Payer: Self-pay | Admitting: *Deleted

## 2015-08-09 ENCOUNTER — Encounter (HOSPITAL_COMMUNITY): Payer: Self-pay | Admitting: *Deleted

## 2015-08-09 NOTE — Telephone Encounter (Signed)
Preadmission screen  

## 2015-08-12 ENCOUNTER — Other Ambulatory Visit: Payer: Self-pay | Admitting: Advanced Practice Midwife

## 2015-08-14 ENCOUNTER — Inpatient Hospital Stay (HOSPITAL_COMMUNITY): Payer: Medicaid Other | Admitting: Anesthesiology

## 2015-08-14 ENCOUNTER — Encounter (HOSPITAL_COMMUNITY): Payer: Self-pay

## 2015-08-14 ENCOUNTER — Inpatient Hospital Stay (HOSPITAL_COMMUNITY)
Admission: RE | Admit: 2015-08-14 | Discharge: 2015-08-18 | DRG: 774 | Disposition: A | Discharge status: Home / Self Care | Payer: Medicaid Other | Source: Ambulatory Visit | Attending: Family Medicine | Admitting: Family Medicine

## 2015-08-14 VITALS — BP 134/77 | HR 84 | Temp 98.1°F | Resp 16 | Ht 62.0 in | Wt 144.3 lb

## 2015-08-14 DIAGNOSIS — O9962 Diseases of the digestive system complicating childbirth: Secondary | ICD-10-CM | POA: Diagnosis present

## 2015-08-14 DIAGNOSIS — O99824 Streptococcus B carrier state complicating childbirth: Secondary | ICD-10-CM | POA: Diagnosis present

## 2015-08-14 DIAGNOSIS — O98819 Other maternal infectious and parasitic diseases complicating pregnancy, unspecified trimester: Secondary | ICD-10-CM

## 2015-08-14 DIAGNOSIS — Z3A40 40 weeks gestation of pregnancy: Secondary | ICD-10-CM

## 2015-08-14 DIAGNOSIS — Z8249 Family history of ischemic heart disease and other diseases of the circulatory system: Secondary | ICD-10-CM

## 2015-08-14 DIAGNOSIS — J45909 Unspecified asthma, uncomplicated: Secondary | ICD-10-CM | POA: Diagnosis present

## 2015-08-14 DIAGNOSIS — O1415 Severe pre-eclampsia, complicating the puerperium: Secondary | ICD-10-CM | POA: Diagnosis not present

## 2015-08-14 DIAGNOSIS — D573 Sickle-cell trait: Secondary | ICD-10-CM | POA: Diagnosis present

## 2015-08-14 DIAGNOSIS — O9952 Diseases of the respiratory system complicating childbirth: Secondary | ICD-10-CM | POA: Diagnosis present

## 2015-08-14 DIAGNOSIS — O9902 Anemia complicating childbirth: Secondary | ICD-10-CM | POA: Diagnosis present

## 2015-08-14 DIAGNOSIS — O480 Post-term pregnancy: Secondary | ICD-10-CM | POA: Diagnosis present

## 2015-08-14 DIAGNOSIS — K219 Gastro-esophageal reflux disease without esophagitis: Secondary | ICD-10-CM | POA: Diagnosis present

## 2015-08-14 DIAGNOSIS — O280 Abnormal hematological finding on antenatal screening of mother: Secondary | ICD-10-CM

## 2015-08-14 DIAGNOSIS — O134 Gestational [pregnancy-induced] hypertension without significant proteinuria, complicating childbirth: Secondary | ICD-10-CM | POA: Diagnosis present

## 2015-08-14 DIAGNOSIS — A749 Chlamydial infection, unspecified: Secondary | ICD-10-CM

## 2015-08-14 LAB — LACTATE DEHYDROGENASE: LDH: 152 U/L (ref 98–192)

## 2015-08-14 LAB — URIC ACID: URIC ACID, SERUM: 5.2 mg/dL (ref 2.3–6.6)

## 2015-08-14 LAB — BASIC METABOLIC PANEL
Anion gap: 7 (ref 5–15)
CALCIUM: 8.7 mg/dL — AB (ref 8.9–10.3)
CO2: 20 mmol/L — ABNORMAL LOW (ref 22–32)
CREATININE: 0.6 mg/dL (ref 0.44–1.00)
Chloride: 109 mmol/L (ref 101–111)
GFR calc Af Amer: >60 mL/min (ref >60)
Glucose, Bld: 84 mg/dL (ref 65–99)
POTASSIUM: 3.7 mmol/L (ref 3.5–5.1)
SODIUM: 136 mmol/L (ref 135–145)

## 2015-08-14 LAB — CBC
HEMATOCRIT: 25.9 % — AB (ref 36.0–46.0)
Hemoglobin: 8.8 g/dL — ABNORMAL LOW (ref 12.0–15.0)
MCH: 25.6 pg — ABNORMAL LOW (ref 26.0–34.0)
MCHC: 34 g/dL (ref 30.0–36.0)
MCV: 75.3 fL — ABNORMAL LOW (ref 78.0–100.0)
Platelets: 259 10*3/uL (ref 150–400)
RBC: 3.44 MIL/uL — ABNORMAL LOW (ref 3.87–5.11)
RDW: 15.8 % — AB (ref 11.5–15.5)
WBC: 6 10*3/uL (ref 4.0–10.5)

## 2015-08-14 LAB — ABO/RH: ABO/RH(D): A POS

## 2015-08-14 LAB — PROTEIN / CREATININE RATIO, URINE
Creatinine, Urine: 21 mg/dL
Total Protein, Urine: <6 mg/dL

## 2015-08-14 LAB — ALT: ALT: 10 U/L — ABNORMAL LOW (ref 14–54)

## 2015-08-14 LAB — TYPE AND SCREEN
ABO/RH(D): A POS
Antibody Screen: NEGATIVE

## 2015-08-14 LAB — RPR: RPR Ser Ql: NONREACTIVE

## 2015-08-14 LAB — AST: AST: 25 U/L (ref 15–41)

## 2015-08-14 MED ORDER — OXYCODONE-ACETAMINOPHEN 5-325 MG PO TABS: 1.0000 | ORAL_TABLET | ORAL | Status: DC | PRN

## 2015-08-14 MED ORDER — DEXTROSE 5 % IV SOLN
5.0000 10*6.[IU] | Freq: Once | INTRAVENOUS | Status: AC
  Administered 2015-08-14: 5 10*6.[IU] via INTRAVENOUS
  Filled 2015-08-14: qty 5

## 2015-08-14 MED ORDER — ACETAMINOPHEN 325 MG PO TABS
650.0000 mg | ORAL_TABLET | ORAL | Status: DC | PRN
  Administered 2015-08-14 – 2015-08-15 (×4): 650 mg via ORAL
  Filled 2015-08-14 (×5): qty 2

## 2015-08-14 MED ORDER — ONDANSETRON HCL 4 MG/2ML IJ SOLN
4.0000 mg | Freq: Four times a day (QID) | INTRAMUSCULAR | Status: DC | PRN
Start: 2015-08-14 — End: 2015-08-15
  Administered 2015-08-15 (×2): 4 mg via INTRAVENOUS
  Filled 2015-08-14 (×2): qty 2

## 2015-08-14 MED ORDER — PHENYLEPHRINE 40 MCG/ML (10ML) SYRINGE FOR IV PUSH (FOR BLOOD PRESSURE SUPPORT)
80.0000 ug | PREFILLED_SYRINGE | INTRAVENOUS | Status: DC | PRN
Start: 2015-08-14 — End: 2015-08-15

## 2015-08-14 MED ORDER — EPHEDRINE 5 MG/ML INJ: 10.0000 mg | INTRAVENOUS | Status: DC | PRN

## 2015-08-14 MED ORDER — DIPHENHYDRAMINE HCL 50 MG/ML IJ SOLN: 12.5000 mg | INTRAMUSCULAR | Status: DC | PRN

## 2015-08-14 MED ORDER — OXYTOCIN 10 UNIT/ML IJ SOLN
1.0000 m[IU]/min | INTRAVENOUS | Status: DC
  Administered 2015-08-14: 2 m[IU]/min via INTRAVENOUS

## 2015-08-14 MED ORDER — FENTANYL 2.5 MCG/ML BUPIVACAINE 1/10 % EPIDURAL INFUSION (WH - ANES)
14.0000 mL/h | INTRAMUSCULAR | Status: DC | PRN
  Administered 2015-08-14 – 2015-08-15 (×4): 14 mL/h via EPIDURAL
  Filled 2015-08-14 (×3): qty 125

## 2015-08-14 MED ORDER — MISOPROSTOL 25 MCG QUARTER TABLET
25.0000 ug | ORAL_TABLET | ORAL | Status: DC | PRN
  Administered 2015-08-14 (×2): 25 ug via VAGINAL
  Filled 2015-08-14 (×2): qty 0.25

## 2015-08-14 MED ORDER — TERBUTALINE SULFATE 1 MG/ML IJ SOLN: 0.2500 mg | Freq: Once | INTRAMUSCULAR | Status: DC | PRN

## 2015-08-14 MED ORDER — LIDOCAINE HCL (PF) 1 % IJ SOLN
30.0000 mL | INTRAMUSCULAR | Status: DC | PRN
  Filled 2015-08-14: qty 30

## 2015-08-14 MED ORDER — CITRIC ACID-SODIUM CITRATE 334-500 MG/5ML PO SOLN
30.0000 mL | ORAL | Status: DC | PRN
Start: 2015-08-14 — End: 2015-08-15
  Administered 2015-08-15: 30 mL via ORAL
  Filled 2015-08-14: qty 15

## 2015-08-14 MED ORDER — PHENYLEPHRINE 40 MCG/ML (10ML) SYRINGE FOR IV PUSH (FOR BLOOD PRESSURE SUPPORT)
PREFILLED_SYRINGE | INTRAVENOUS | Status: AC
  Filled 2015-08-14: qty 20

## 2015-08-14 MED ORDER — PHENYLEPHRINE 40 MCG/ML (10ML) SYRINGE FOR IV PUSH (FOR BLOOD PRESSURE SUPPORT): 80.0000 ug | PREFILLED_SYRINGE | INTRAVENOUS | Status: DC | PRN

## 2015-08-14 MED ORDER — FENTANYL 2.5 MCG/ML BUPIVACAINE 1/10 % EPIDURAL INFUSION (WH - ANES)
INTRAMUSCULAR | Status: AC
  Filled 2015-08-14: qty 125

## 2015-08-14 MED ORDER — LACTATED RINGERS IV SOLN
500.0000 mL | INTRAVENOUS | Status: DC | PRN
  Administered 2015-08-14 (×2): 500 mL via INTRAVENOUS
  Administered 2015-08-15: 300 mL via INTRAVENOUS
  Administered 2015-08-15: 200 mL via INTRAVENOUS

## 2015-08-14 MED ORDER — LACTATED RINGERS IV SOLN: 500.0000 mL | Freq: Once | INTRAVENOUS | Status: DC

## 2015-08-14 MED ORDER — OXYTOCIN BOLUS FROM INFUSION
500.0000 mL | INTRAVENOUS | Status: DC
  Administered 2015-08-15: 500 mL via INTRAVENOUS

## 2015-08-14 MED ORDER — LIDOCAINE HCL (PF) 1 % IJ SOLN
INTRAMUSCULAR | Status: DC | PRN
  Administered 2015-08-14: 888 mL via EPIDURAL
  Administered 2015-08-14: 8 mL via EPIDURAL

## 2015-08-14 MED ORDER — OXYCODONE-ACETAMINOPHEN 5-325 MG PO TABS: 2.0000 | ORAL_TABLET | ORAL | Status: DC | PRN

## 2015-08-14 MED ORDER — LACTATED RINGERS IV SOLN
2.5000 [IU]/h | INTRAVENOUS | Status: DC
  Filled 2015-08-14: qty 4

## 2015-08-14 MED ORDER — TERBUTALINE SULFATE 1 MG/ML IJ SOLN
0.2500 mg | Freq: Once | INTRAMUSCULAR | Status: DC | PRN
Start: 2015-08-14 — End: 2015-08-14

## 2015-08-14 MED ORDER — DEXTROSE 5 % IV SOLN
2.5000 10*6.[IU] | INTRAVENOUS | Status: DC
  Administered 2015-08-14 – 2015-08-15 (×8): 2.5 10*6.[IU] via INTRAVENOUS
  Filled 2015-08-14 (×13): qty 2.5

## 2015-08-14 MED ORDER — LACTATED RINGERS IV SOLN
INTRAVENOUS | Status: DC
  Administered 2015-08-14 – 2015-08-15 (×4): via INTRAVENOUS

## 2015-08-14 NOTE — Progress Notes (Signed)
Labor Progress Note  Michelle Calhoun is a 20 y.o. G1P0 at [redacted]w[redacted]d  admitted for induction of labor due to HTN.  S:  FB fell out. Doing well. Is feeling contractions. Desires epidural eventually, is okay now. Denies HA or visual changes.   O:  BP 160/96 mmHg  Pulse 62  Temp(Src) 98.5 F (36.9 C) (Oral)  Resp 18  Ht  (1.575 m)  Wt 68.04 kg (150 lb)  BMI 27.43 kg/m2  SpO2 100%  LMP 10/19/2014     FHT:  FHR: 150 bpm, variability: moderate,  accelerations:  Present,  decelerations:  Absent UC:  Every 2-5 minutes SVE:   Dilation: 1 Effacement (%): 80 Station: -2, -1 Exam by:: Eglutis, MD Intact  Pitocin  Labs: Lab Results  Component Value Date   WBC 6.0 08/14/2015   HGB 8.8* 08/14/2015   HCT 25.9* 08/14/2015   MCV 75.3* 08/14/2015   PLT 259 08/14/2015    Assessment / Plan: 20 y.o. G1P0 [redacted]w[redacted]d IOL for GHTN. S/p Foley Bulb. Pit started 1430.    Labor: Progressing normally on Pitocin.  Fetal Wellbeing:  Category I Pain Control:  Patient desires eventual epidural; currently pain is manageable.  Anticipated MOD:  NSVD   Palma Holter, MD PGY 1 Family Medicine

## 2015-08-14 NOTE — Progress Notes (Signed)
Michelle Calhoun is a 20 y.o. G1P0 at [redacted]w[redacted]d by ultrasound admitted for induction of labor due to Hypertension.  Subjective:   Objective: BP 148/96 mmHg  Pulse 63  Temp(Src) 98.5 F (36.9 C) (Oral)  Resp 18  Ht  (1.575 m)  Wt 150 lb (68.04 kg)  BMI 27.43 kg/m2  SpO2 100%  LMP 10/19/2014      FHT:  FHR: 150's bpm, variability: moderate,  accelerations:  Abscent,  decelerations:  Absent UC:   regular, every 2 minutes SVE:   Dilation: 1 Effacement (%): 80 Station: -2, -1 Exam by:: Eglutis, MD  Labs: Lab Results  Component Value Date   WBC 6.0 08/14/2015   HGB 8.8* 08/14/2015   HCT 25.9* 08/14/2015   MCV 75.3* 08/14/2015   PLT 259 08/14/2015    Assessment / Plan: Induction of labor due to gestational hypertension,  progressing well on pitocin  Labor: yet to be in labor Preeclampsia:  no signs or symptoms of toxicity, intake and ouput balanced and labs stable Fetal Wellbeing:  Category I Pain Control:  Labor support without medications I/D:  n/a Anticipated MOD:  NSVD  Michelle Calhoun 08/14/2015, 6:58 PM

## 2015-08-14 NOTE — Anesthesia Procedure Notes (Signed)
Epidural Patient location during procedure: OB Start time: 08/14/2015 9:50 PM End time: 08/14/2015 9:59 PM  Staffing Anesthesiologist: Leilani Able Performed by: anesthesiologist   Preanesthetic Checklist Completed: patient identified, surgical consent, pre-op evaluation, timeout performed, IV checked, risks and benefits discussed and monitors and equipment checked  Epidural Patient position: sitting Prep: site prepped and draped and DuraPrep Patient monitoring: continuous pulse ox and blood pressure Approach: midline Location: L3-L4 Injection technique: LOR air  Needle:  Needle type: Tuohy  Needle gauge: 17 G Needle length: 9 cm and 9 Needle insertion depth: 6 cm Catheter type: closed end flexible Catheter size: 19 Gauge Catheter at skin depth: 11 cm Test dose: negative and Other  Assessment Sensory level: T9 Events: blood not aspirated, injection not painful, no injection resistance, negative IV test and paresthesia  Additional Notes L leg X 2 with the catheter. Pain goal is 4.Reason for block:procedure for pain

## 2015-08-14 NOTE — Progress Notes (Addendum)
Patient states she had a "pain in heart" that was described as "heavy" and assessed by prior shift RN.  Patient states she no longer has that feeling.  Advised patient to let RN know if feeling returns.

## 2015-08-14 NOTE — Progress Notes (Signed)
Labor Progress Note  Michelle Calhoun is a 20 y.o. G1P0 at [redacted]w[redacted]d  admitted for IOL for GHTN  S:  Nursing reported patient complained of chest pressure. Patient reports she has some central chest pressure. First episode occurred yesterday evening and lasted 2-3 mins. States this episode is similar and she has associated shortness of breath. No nausea associated. States chest pressure is worse with deep inspiration and when laying back. Has a history of asthma and GERD; symptoms are not similar to her asthma or GERD. No cardiac history. No history of DVT. Symptoms resolved during history taking.    O:  BP 137/90 mmHg  Pulse 72  Temp(Src) 98.5 F (36.9 C) (Oral)  Resp 18  Ht  (1.575 m)  Wt 68.04 kg (150 lb)  BMI 27.43 kg/m2  SpO2 100%  LMP 10/19/2014  Gen: in some distress due to contractions.  CV: RRR, no m/r/g Chest: tenderness to palpation of sternum (patient reports this is the same pain she felt before) Lungs: CTAB, normal effort, oxygen sats normal on room air LE: no swelling or calf tenderness  A/P: Likely consistent with costochondritis with the same pain elicited with palpation of chest. Normal lung exam. Resolved during history taking. Unlikely PE as oxygen sats normal and normal lung exam. - will continue to monitor.

## 2015-08-14 NOTE — Progress Notes (Signed)
Patient ID: Michelle Calhoun, female   DOB: [birth year], 20 y.o.   MRN: [id number] S: minimal discomfort from contractions O: SVE 1/80/-1 foly bulb inserted using sterile technique and inflate with 60 cc sterile fluid. FHR 150, accels note, no decels, uc's q 2 mild per palpation.  A: preg @ 40.6 IOL for GHTN. Stable maternal fetal unit P: pit induction of labor

## 2015-08-14 NOTE — Progress Notes (Signed)
ANMOL PASCHEN is a 20 y.o. G1P0 at [redacted]w[redacted]d by ultrasound admitted for induction of labor due to Hypertension.  Subjective:   Objective: BP 145/79 mmHg  Pulse 73  Temp(Src) 98.4 F (36.9 C) (Oral)  Resp 18  Ht  (1.575 m)  Wt 150 lb (68.04 kg)  BMI 27.43 kg/m2  SpO2 100%  LMP 10/19/2014      FHT:  FHR: 145-150 bpm, variability: moderate,  accelerations:  Present,  decelerations:  Absent UC:   regular, every 2 minutes SVE:   Dilation: 1 Effacement (%): 80 Station: -2, -1 Exam by:: Eglutis, MD  Labs: Lab Results  Component Value Date   WBC 6.0 08/14/2015   HGB 8.8* 08/14/2015   HCT 25.9* 08/14/2015   MCV 75.3* 08/14/2015   PLT 259 08/14/2015    Assessment / Plan: Induction of labor due to gestational hypertension,  progressing well on pitocin  Labor: yet to be in labor Preeclampsia:  no signs or symptoms of toxicity, intake and ouput balanced and labs stable Fetal Wellbeing:  Category I Pain Control:  Labor support without medications I/D:  n/a Anticipated MOD:  NSVD  [name], [name] 08/14/2015, 3:52 PM

## 2015-08-14 NOTE — Progress Notes (Signed)
LABOR progress Note  Michelle Calhoun is a 21 y.o. female G1P0 with IUP at [redacted]w[redacted]d by L/20 presenting for PDIOL. Pregnancy c/b teen pregnancy, anemia (history of sickle cell trait, FOB has not been tested), +chlamydia (treated), abnormal quad screen with increased risks of downs.   S: patient is feeling well. Denies any headache, blurry VA, RUQ pain. Endorses mild pain at her sides. Not really feeling her ctx. Denies LOF or bleeding. Feeling baby move.   O:  Filed Vitals:   08/14/15 0601 08/14/15 0618  BP: 164/115 148/92  Pulse: 64 74  Temp:  98.2 F (36.8 C)  Resp: 18 18   BP Readings from Last 3 Encounters:  08/14/15 148/92  07/13/15 128/67  06/20/15 119/64   General: Well appearing, in NAD, not uncomfortable HEENT: NCAT, anicteric sclera, conjunctivae non injected Lungs: breathing unlabored with symmetric chest rise  SVE: 1/50/-2 (outer os, soft and more dilated) TOCO: Notable for ctx roughly 2 per 10 minutes FHR: baseline 180s, notable for accelerations, category I strip other than now tachycardic from prior baseline of 155-160.   Plan: - Cytotec placed given she was not feeling ctx.  - Blood pressure remains intermittently elevated, though severe range always decrease on repeat. Has not yet required medication. HELLP panel negative. CTM - fetal tachycardia: No maternal fever, not ruptured, no additional signs of infection. CTM.

## 2015-08-14 NOTE — Anesthesia Preprocedure Evaluation (Signed)
Anesthesia Evaluation  Patient identified by MRN, date of birth, ID band Patient awake    Reviewed: Allergy & Precautions, H&P , NPO status , Patient's Chart, lab work & pertinent test results  Airway Mallampati: I  TM Distance: >3 FB Neck ROM: full    Dental no notable dental hx.    Pulmonary    Pulmonary exam normal        Cardiovascular negative cardio ROS Normal cardiovascular exam     Neuro/Psych negative neurological ROS  negative psych ROS   GI/Hepatic Neg liver ROS, GERD  Medicated and Controlled,  Endo/Other  negative endocrine ROS  Renal/GU negative Renal ROS     Musculoskeletal   Abdominal Normal abdominal exam  (+)   Peds  Hematology negative hematology ROS (+)   Anesthesia Other Findings   Reproductive/Obstetrics (+) Pregnancy                             Anesthesia Physical Anesthesia Plan  ASA: II  Anesthesia Plan: Epidural   Post-op Pain Management:    Induction:   Airway Management Planned:   Additional Equipment:   Intra-op Plan:   Post-operative Plan:   Informed Consent: I have reviewed the patients History and Physical, chart, labs and discussed the procedure including the risks, benefits and alternatives for the proposed anesthesia with the patient or authorized representative who has indicated his/her understanding and acceptance.     Plan Discussed with:   Anesthesia Plan Comments:         Anesthesia Quick Evaluation

## 2015-08-14 NOTE — Consults (Signed)
  Anesthesia Pain Consult Note  Patient: Michelle Calhoun, 20 y.o., female  Consult Requested by: Willodean Rosenthal, *  Reason for Consult: Pain Management  Level of Consciousness: alert  Pain: no   Last Vitals:  Filed Vitals:   08/14/15 0901 08/14/15 0935  BP: 143/87 146/96  Pulse: 80 66  Temp: 36.9 C   Resp: 18 18    Plan:  Anesthesia Consult   Pt interviewed and introduced to pain management goals during her stay at [facility].  Discussed with patient varying options for pain control throughout her laboring experience.  Pt established a pain goal of 3.  Pt denies any current pain.  Encouraged pt to communicate with her L/D RN regarding need for addressing pain as pain level increases.  Epidural pain management discussed with patient as an option and patient encouraged to vocalize initial and continued pain management needs often in order to achieve her pain management goals.       [facility] 08/14/2015

## 2015-08-14 NOTE — H&P (Signed)
LABOR AND DELIVERY ADMISSION HISTORY AND PHYSICAL NOTE  Michelle Calhoun is a 20 y.o. female G1P0 with IUP at [redacted]w[redacted]d by L/20 presenting for PDIOL. Pregnancy c/b teen pregnancy, anemia (history of sickle cell trait, FOB has not been tested), +chlamydia (treated), abnormal quad screen with increased risks of downs.   She reports positive fetal movement. She denies leakage of fluid or vaginal bleeding. She endorses history of vaginal yeast infection in pregnancy, no additional vaginal infection. She has a history of surgery for fracture of her leg, no abdominal surgeries. Has used occasional NSAIDs in her 3rd trimester.   Prenatal History/Complications:  Past Medical History: Past Medical History  Diagnosis Date  . Asthma   . Medical history non-contributory   . Vaginal Pap smear, abnormal   . Hx of chlamydia infection     Past Surgical History: Past Surgical History  Procedure Laterality Date  . No past surgeries      Obstetrical History: OB History    Gravida Para Term Preterm AB TAB SAB Ectopic Multiple Living   1               Social History: Social History   Social History  . Marital Status: Single    Spouse Name: N/A  . Number of Children: N/A  . Years of Education: N/A   Social History Main Topics  . Smoking status: Never Smoker   . Smokeless tobacco: Never Used  . Alcohol Use: No     Comment: denies 08/28/14  . Drug Use: No  . Sexual Activity: Yes    Birth Control/ Protection: None   Other Topics Concern  . None   Social History Narrative    Family History: Family History  Problem Relation Age of Onset  . Asthma Brother   . Hypertension Maternal Grandmother   . Alcohol abuse Neg Hx   . Arthritis Neg Hx   . Birth defects Neg Hx   . Cancer Neg Hx   . COPD Neg Hx   . Depression Neg Hx   . Diabetes Neg Hx   . Drug abuse Neg Hx   . Early death Neg Hx   . Hearing loss Neg Hx   . Hyperlipidemia Neg Hx   . Heart disease Neg Hx   . Kidney disease  Neg Hx   . Learning disabilities Neg Hx   . Mental illness Neg Hx   . Mental retardation Neg Hx   . Miscarriages / Stillbirths Neg Hx   . Stroke Neg Hx   . Vision loss Neg Hx   . Varicose Veins Neg Hx     Allergies: No Known Allergies  Prescriptions prior to admission  Medication Sig Dispense Refill Last Dose  . diphenhydrAMINE (BENADRYL) 25 MG tablet Take 25 mg by mouth every 6 (six) hours as needed for allergies or sleep.   Past Month at Unknown time  . docusate sodium (COLACE) 100 MG capsule Take 1 capsule (100 mg total) by mouth 2 (two) times daily. 20 capsule 0 06/19/2015 at Unknown time  . omeprazole (PRILOSEC) 20 MG capsule Take 1 capsule (20 mg total) by mouth daily. 30 capsule 0 06/19/2015 at Unknown time  . Prenatal Vit-Fe Fumarate-FA (PRENATAL MULTIVITAMIN) TABS tablet Take 1 tablet by mouth daily at 12 noon.   06/19/2015 at Unknown time     Review of Systems   All systems reviewed and negative except as stated in HPI  Blood pressure 144/83, pulse 82, temperature 98.9 F (37.2 C),  temperature source Oral, resp. rate 18, height  (1.575 m), weight 150 lb (68.04 kg), last menstrual period 10/19/2014. General appearance: alert, cooperative and appears stated age Lungs: clear to auscultation bilaterally Heart: regular rate and rhythm Abdomen: soft, non-tender; bowel sounds normal Extremities: No calf swelling or tenderness Presentation: cephalic on leopolds Fetal monitoring: category I Uterine activity: quiet Dilation: Closed Effacement (%): Thick Station: -3 Exam by:: Ashok Pall, MD   Prenatal labs: ABO, Rh: --/--/A POS (02/12 0122) Antibody: PENDING (02/12 0122) Rubella: !Error! RPR: Nonreactive (09/12 0000)  HBsAg: Negative (09/12 0000)  HIV: Non-reactive (09/12 0000)  GBS: Positive (01/09 0000)  1 hr Glucola: 103 Genetic screening:  Quad screen 1:8 down syndrome risk , elevated DIA, declined NIPS and amnio Anatomy US: normal   Prenatal Transfer Tool   Maternal Diabetes: No Genetic Screening: Abnormal:  Results: Elevated risk of Trisomy 21 Maternal Ultrasounds/Referrals: Normal Fetal Ultrasounds or other Referrals:  None Maternal Substance Abuse:  No Significant Maternal Medications:  Noted occasional use of NSAIDs in the 3rd trimester Significant Maternal Lab Results: Lab values include: Group B Strep positive, Other: Hgb of 8.8, though noted to have sickle cell trait  Results for orders placed or performed during the hospital encounter of 08/14/15 (from the past 24 hour(s))  CBC   Collection Time: 08/14/15  1:22 AM  Result Value Ref Range   WBC 6.0 4.0 - 10.5 K/uL   RBC 3.44 (L) 3.87 - 5.11 MIL/uL   Hemoglobin 8.8 (L) 12.0 - 15.0 g/dL   HCT 16.1 (L) 09.6 - 04.5 %   MCV 75.3 (L) 78.0 - 100.0 fL   MCH 25.6 (L) 26.0 - 34.0 pg   MCHC 34.0 30.0 - 36.0 g/dL   RDW 40.9 (H) 81.1 - 91.4 %   Platelets 259 150 - 400 K/uL  Type and screen   Collection Time: 08/14/15  1:22 AM  Result Value Ref Range   ABO/RH(D) A POS    Antibody Screen PENDING    Sample Expiration 08/17/2015     Patient Active Problem List   Diagnosis Date Noted  . Post term pregnancy over 40 weeks 08/14/2015  . Abnormal maternal serum screening test 03/28/2015  . Chlamydia infection affecting pregnancy 03/28/2015    Assessment: Michelle Calhoun is a 20 y.o. G1P0 at [redacted]w[redacted]d here for PDIOL, while she is 1 day short of 41w still feel it is appropriate to begin IOL rather than deferring for 24 hours. Pregnancy has been complicated by abnormal quad screen with an increased risk of down syndrome, maternal history of sickle cell trait (FOB has not been tested), asthma and anemia.   #Labor:IOL with cytotec placed at 0200, unable to place foley at that time given closed cervix.  - cytotec q4 hours prn - FB placement in 8 hours - plan for pitocin when FB is out  #Pain: Will continue to discuss with patient as needed  #FWB: Category I strip - continuous monitoring given  cytotec #ID: GBS +: PCN started #MOF: Both #MOC:Nexplanon  #Blood pressure: patient has no history of gHTN or cHTN. After exam had BP of 160, will cycle and if remains elevated treat accordingly. Discussed with RN.   Autumn Eglitis 08/14/2015, 2:07 AM   OB FELLOW HISTORY AND PHYSICAL ATTESTATION  I have seen and examined this patient; I agree with above documentation in the resident's note.    Cherrie Gauze Wouk 08/14/2015, 4:00 AM

## 2015-08-14 NOTE — Progress Notes (Signed)
Michelle Calhoun is a 20 y.o. G1P0 at [redacted]w[redacted]d by ultrasound admitted for induction of labor due to Hypertension.  Subjective:   Objective: BP 146/88 mmHg  Pulse 66  Temp(Src) 98.5 F (36.9 C) (Oral)  Resp 18  Ht  (1.575 m)  Wt 150 lb (68.04 kg)  BMI 27.43 kg/m2  SpO2 100%  LMP 10/19/2014      FHT:  FHR: 145 bpm, variability: moderate,  accelerations:  Abscent,  decelerations:  Absent UC:   irregular, every 2-4 minutes and mild SVE:   Dilation: 1 Effacement (%): 50 Station: -3 Exam by:: Eglutis, MD  Labs: Lab Results  Component Value Date   WBC 6.0 08/14/2015   HGB 8.8* 08/14/2015   HCT 25.9* 08/14/2015   MCV 75.3* 08/14/2015   PLT 259 08/14/2015    Assessment / Plan: Induction of labor due to gestational hypertension,  progressing well on pitocin  Labor: yet to be in labor Preeclampsia:  no signs or symptoms of toxicity, intake and ouput balanced and labs stable Fetal Wellbeing:  Category I Pain Control:  Labor support without medications I/D:  n/a Anticipated MOD:  NSVD  Michelle Calhoun 08/14/2015, 11:25 AM

## 2015-08-14 NOTE — Progress Notes (Signed)
Michelle Calhoun is a 20 y.o. G1P0 at [redacted]w[redacted]d by ultrasound admitted for induction of labor due to Hypertension.  Subjective:   Objective: BP 143/87 mmHg  Pulse 80  Temp(Src) 98.5 F (36.9 C) (Oral)  Resp 18  Ht  (1.575 m)  Wt 150 lb (68.04 kg)  BMI 27.43 kg/m2  SpO2 100%  LMP 10/19/2014      FHT:  FHR: 150's bpm, variability: moderate,  accelerations:  Present,  decelerations:  Absent UC:   Mild uc's q 2-4 min apart SVE:   Dilation: 1 Effacement (%): 50 Station: -3 Exam by:: Eglutis, MD  Labs: Lab Results  Component Value Date   WBC 6.0 08/14/2015   HGB 8.8* 08/14/2015   HCT 25.9* 08/14/2015   MCV 75.3* 08/14/2015   PLT 259 08/14/2015    Assessment / Plan: Induction of labor due to gestational hypertension,  progressing well on pitocin  Labor: yet to be in labor Preeclampsia:  no signs or symptoms of toxicity, intake and ouput balanced and labs stable Fetal Wellbeing:  Category I Pain Control:  Labor support without medications I/D:  n/a Anticipated MOD:  NSVD  Michelle Calhoun 08/14/2015, 9:23 AM

## 2015-08-15 ENCOUNTER — Encounter (HOSPITAL_COMMUNITY): Payer: Self-pay

## 2015-08-15 DIAGNOSIS — D573 Sickle-cell trait: Secondary | ICD-10-CM

## 2015-08-15 DIAGNOSIS — O9962 Diseases of the digestive system complicating childbirth: Secondary | ICD-10-CM

## 2015-08-15 DIAGNOSIS — O480 Post-term pregnancy: Secondary | ICD-10-CM

## 2015-08-15 DIAGNOSIS — O99824 Streptococcus B carrier state complicating childbirth: Secondary | ICD-10-CM

## 2015-08-15 DIAGNOSIS — O9902 Anemia complicating childbirth: Secondary | ICD-10-CM

## 2015-08-15 DIAGNOSIS — O9952 Diseases of the respiratory system complicating childbirth: Secondary | ICD-10-CM

## 2015-08-15 DIAGNOSIS — O134 Gestational [pregnancy-induced] hypertension without significant proteinuria, complicating childbirth: Secondary | ICD-10-CM

## 2015-08-15 DIAGNOSIS — Z3A40 40 weeks gestation of pregnancy: Secondary | ICD-10-CM

## 2015-08-15 DIAGNOSIS — O1415 Severe pre-eclampsia, complicating the puerperium: Secondary | ICD-10-CM

## 2015-08-15 LAB — BASIC METABOLIC PANEL
Anion gap: 10 (ref 5–15)
BUN: 7 mg/dL (ref 6–20)
CHLORIDE: 106 mmol/L (ref 101–111)
CO2: 21 mmol/L — AB (ref 22–32)
CREATININE: 1.7 mg/dL — AB (ref 0.44–1.00)
Calcium: 8.6 mg/dL — ABNORMAL LOW (ref 8.9–10.3)
GFR calc Af Amer: 49 mL/min — ABNORMAL LOW (ref >60)
GFR calc non Af Amer: 43 mL/min — ABNORMAL LOW (ref >60)
GLUCOSE: 93 mg/dL (ref 65–99)
Potassium: 3.8 mmol/L (ref 3.5–5.1)
Sodium: 137 mmol/L (ref 135–145)

## 2015-08-15 LAB — AST: AST: 35 U/L (ref 15–41)

## 2015-08-15 LAB — ALT: ALT: 12 U/L — AB (ref 14–54)

## 2015-08-15 LAB — PLATELET COUNT: Platelets: 238 10*3/uL (ref 150–400)

## 2015-08-15 LAB — URIC ACID: Uric Acid, Serum: 5.3 mg/dL (ref 2.3–6.6)

## 2015-08-15 LAB — LACTATE DEHYDROGENASE: LDH: 216 U/L — AB (ref 98–192)

## 2015-08-15 MED ORDER — LABETALOL HCL 5 MG/ML IV SOLN
20.0000 mg | Freq: Once | INTRAVENOUS | Status: AC
  Administered 2015-08-15: 20 mg via INTRAVENOUS
  Filled 2015-08-15: qty 4

## 2015-08-15 MED ORDER — PRENATAL MULTIVITAMIN CH
1.0000 | ORAL_TABLET | Freq: Every day | ORAL | Status: DC
  Administered 2015-08-16 – 2015-08-18 (×4): 1 via ORAL
  Filled 2015-08-15 (×4): qty 1

## 2015-08-15 MED ORDER — TETANUS-DIPHTH-ACELL PERTUSSIS 5-2.5-18.5 LF-MCG/0.5 IM SUSP: 0.5000 mL | Freq: Once | INTRAMUSCULAR | Status: DC

## 2015-08-15 MED ORDER — SIMETHICONE 80 MG PO CHEW
80.0000 mg | CHEWABLE_TABLET | ORAL | Status: DC | PRN
  Filled 2015-08-15: qty 1

## 2015-08-15 MED ORDER — IBUPROFEN 600 MG PO TABS
600.0000 mg | ORAL_TABLET | Freq: Four times a day (QID) | ORAL | Status: DC
Start: 2015-08-15 — End: 2015-08-18
  Administered 2015-08-15 – 2015-08-18 (×12): 600 mg via ORAL
  Filled 2015-08-15 (×12): qty 1

## 2015-08-15 MED ORDER — DIBUCAINE 1 % RE OINT
1.0000 | TOPICAL_OINTMENT | RECTAL | Status: DC | PRN
Start: 2015-08-15 — End: 2015-08-18

## 2015-08-15 MED ORDER — WITCH HAZEL-GLYCERIN EX PADS: 1.0000 "application " | MEDICATED_PAD | CUTANEOUS | Status: DC | PRN

## 2015-08-15 MED ORDER — DIPHENHYDRAMINE HCL 25 MG PO CAPS: 25.0000 mg | ORAL_CAPSULE | Freq: Four times a day (QID) | ORAL | Status: DC | PRN

## 2015-08-15 MED ORDER — LABETALOL HCL 5 MG/ML IV SOLN: 20.0000 mg | INTRAVENOUS | Status: DC | PRN

## 2015-08-15 MED ORDER — ZOLPIDEM TARTRATE 5 MG PO TABS: 5.0000 mg | ORAL_TABLET | Freq: Every evening | ORAL | Status: DC | PRN

## 2015-08-15 MED ORDER — ACETAMINOPHEN 325 MG PO TABS
650.0000 mg | ORAL_TABLET | ORAL | Status: DC | PRN
  Administered 2015-08-16: 650 mg via ORAL
  Filled 2015-08-15: qty 2

## 2015-08-15 MED ORDER — MAGNESIUM SULFATE BOLUS VIA INFUSION
6.0000 g | Freq: Once | INTRAVENOUS | Status: AC
  Administered 2015-08-15: 6 g via INTRAVENOUS
  Filled 2015-08-15: qty 500

## 2015-08-15 MED ORDER — MAGNESIUM SULFATE 50 % IJ SOLN
1.0000 g/h | INTRAVENOUS | Status: AC
  Filled 2015-08-15: qty 80

## 2015-08-15 MED ORDER — ONDANSETRON HCL 4 MG/2ML IJ SOLN
4.0000 mg | INTRAMUSCULAR | Status: DC | PRN
  Administered 2015-08-16: 4 mg via INTRAVENOUS
  Filled 2015-08-15: qty 2

## 2015-08-15 MED ORDER — LACTATED RINGERS IV SOLN
INTRAVENOUS | Status: DC
  Administered 2015-08-15 – 2015-08-16 (×2): via INTRAVENOUS

## 2015-08-15 MED ORDER — OXYCODONE-ACETAMINOPHEN 5-325 MG PO TABS
2.0000 | ORAL_TABLET | ORAL | Status: DC | PRN
  Administered 2015-08-15 – 2015-08-18 (×7): 2 via ORAL
  Filled 2015-08-15 (×7): qty 2

## 2015-08-15 MED ORDER — LANOLIN HYDROUS EX OINT: TOPICAL_OINTMENT | CUTANEOUS | Status: DC | PRN

## 2015-08-15 MED ORDER — BENZOCAINE-MENTHOL 20-0.5 % EX AERO
1.0000 | INHALATION_SPRAY | CUTANEOUS | Status: DC | PRN
Start: 2015-08-15 — End: 2015-08-18
  Filled 2015-08-15: qty 56

## 2015-08-15 MED ORDER — HYDRALAZINE HCL 20 MG/ML IJ SOLN: 10.0000 mg | Freq: Once | INTRAMUSCULAR | Status: DC | PRN

## 2015-08-15 MED ORDER — ONDANSETRON HCL 4 MG PO TABS
4.0000 mg | ORAL_TABLET | ORAL | Status: DC | PRN
  Administered 2015-08-18: 4 mg via ORAL
  Filled 2015-08-15: qty 1

## 2015-08-15 MED ORDER — SENNOSIDES-DOCUSATE SODIUM 8.6-50 MG PO TABS
2.0000 | ORAL_TABLET | ORAL | Status: DC
  Administered 2015-08-16 – 2015-08-17 (×2): 2 via ORAL
  Filled 2015-08-15 (×2): qty 2

## 2015-08-15 NOTE — Progress Notes (Addendum)
POST PARTUM PROGRESS NOTE:  This is a 20 y/o G1P0 at 14 and 0/[redacted] weeks gestation admitted yesterday for IOL. Pregnancy has been complicated by an abnormal quad screen with an increased risk of down syndrome, maternal history of sickle cell trait (FOB has not been tested), asthma and anemia.gHTN was diagnosed at time of admission, without sustained severe range blood pressure, did not require medication, negative HELLP panel x 2. ROM ~15 hours. Fluid clear initially then meconium noted at delivery. GBS+ but mother received adequate treatment.   Called to patient room post partum for ongoing elevated BP. Had BP of 174/102, repeat 172/102. Patient endorses headache, had tylenol to help at 1200. (now 1600) which did help slightly but did not fully go away and is now 7/10. Additionally she has blurry VA, epigastric pain and nausea, RUQ pain and some abdominal cramping.   Filed Vitals:   08/15/15 1614 08/15/15 1616  BP: 174/102 172/102  Pulse: 72 71  Temp:    Resp:     General: well appearing though fatigued female, in NAD Cardiac: RRR, no m/r/g Lung: CTAB, breathing unlabored Abdomen: RUQ TTP > remaining abdominal exam  A/P: Given elevated BP and symptoms meets criteria for severe pre-eclampsia   - advised repeat Tylenol for headache, avoid NSAIDs - labetalol  IV x 1 to decrease BP and started on labetalol protocol  - initiate magnesium x 24 hours post partum   Addendum 1753 - LDH elevated at 216, previously normal , Cr elevated at 1.7 from baseline of 0.6, PLT, AST/ALT, uric acid wnl. UPC pending. BP responded to labetalol back to mild range.  - would repeat labs in 8-12 hours.

## 2015-08-15 NOTE — Progress Notes (Addendum)
LABOR PROGRESS NOTE  Michelle Calhoun is a 20 y.o. G1P0 at [redacted]w[redacted]d admitted for PDIOL, subsequently found to have GHTN. Pregnancy c/b teen pregnancy, anemia (history of sickle cell trait, FOB has not been tested), +chlamydia (treated), abnormal quad screen with increased risks of downs (declined follow up testing)  S: She continues to do well. No current complaints or concerns.   Filed Vitals:   08/15/15 0901 08/15/15 0931  BP: 148/96 138/92  Pulse: 79 93  Temp:  98.1 F (36.7 C)  Resp: 18 16   FHT: category I TOCO: IUPC in place SVE: 9/80/-1 per RN check at sign out, however at recheck thought to be closer to 8cm but has since progressed this am to 9 SROM 0025  Plan: - pitocin resumed (briefly held for decels and decreased variability - responded to fluid, oxygen and repositing) - epidural for pain control

## 2015-08-15 NOTE — Progress Notes (Signed)
[facility]  Delivery Note:  SVD   08/15/2015  3:46 PM  I was called to the delivery room at the request of the patient's obstetrician (Dr. Stinson) for apnea in the infant.  PRENATAL HX:  This is a 19 y/o G1P0 at 41 and 0/[redacted] weeks gestation admitted yesterday for IOL.  He pregnancy has been complicated by an abnormal quad screen with an increased risk of down syndrome, maternal history of sickle cell trait (FOB has not been tested), asthma and anemia.   ROM ~15 hours. Fluid clear initially then meconium noted at delivery.  GBS+ but mother received adequate treatment.    DELIVERY:  Infant apneic so code APGAR called.  PPV administered for <30 seconds by L and D team.  NICU arrived at 3 minutes of age.  Infant appeared stunned but was breathing spontaneously and had HR >100.  O2 saturations were in high 70s by 5 minutes of age so BBO2 was given for ~ 1 minute.  O2 saturations improved, but remained borderline in mid to high 80s after BBO2 removed.  Infant also had sustained tachycardic to 190s-200s.  Her exam was notable for retractions and nasal flaring.  She does not have features consistent with Trisomy 21. APGARs 2/6/8.  Initially the infant was going to be admitted to the NICU for observation and possible sepsis evaluation, however by the time she was taken to the NICU, her O2 saturations were 98%, her WOB was comfortable, and her HR and come down to 161.  At this time, NICU observation no longer seemed warranted given lack of perinatal sepsis risk factors and clinical improvement, so she will be observed on pulse oximeter in mother's room for now.  NICU to be notified of any vital sign instability, increased work of breathing, or any other concerns.     _____________________ Electronically Signed By: Jadira Nierman, MD Neonatologist  

## 2015-08-15 NOTE — Progress Notes (Signed)
Labor Progress Note  Michelle Calhoun is a 20 y.o. G1P0 at [redacted]w[redacted]d  admitted for induction of labor due to [facility].  S:  Patient doing well. Variables noted and prolonged decel noted more recently. SROM at 0025. Positional changes and 200cc bolus by nursing. Started supplemental oxygen. Stopped Pitocin.   O:  BP 122/78 mmHg  Pulse 75  Temp(Src) 98.2 F (36.8 C) (Oral)  Resp 18  Ht  (1.575 m)  Wt 68.04 kg (150 lb)  BMI 27.43 kg/m2  SpO2 100%  LMP 10/19/2014    FHT:  FHR: 160 bpm, variability: min-mod ,  accelerations:  Abscent,  decelerations:  Present prolonged decel noted 0025, variable decel noted shortly after over the past 20 mins UC:  Difficult to monitor with toco SVE:   Dilation: 6 Effacement (%): 80 Station: -1 Exam by:: Rogelio Seen, RN  SROM 0025  Labs: Lab Results  Component Value Date   WBC 6.0 08/14/2015   HGB 8.8* 08/14/2015   HCT 25.9* 08/14/2015   MCV 75.3* 08/14/2015   PLT 259 08/14/2015    Assessment / Plan: 20 y.o. G1P0 [redacted]w[redacted]d active labor    Labor: Progressing normally. Pitocin held due to decels and min-mod variability. S/p position change, 200cc bolus. Oxygen now placed. IUPC placed 0038 by Ms. Zerita Boers.  Fetal Wellbeing:  Category II; prolonged decel Pain Control:  Epidural Anticipated MOD:  NSVD  Palma Holter, MD PGY 1 Family Medicine

## 2015-08-15 NOTE — Progress Notes (Addendum)
LABOR PROGRESS NOTE  Michelle Calhoun is a 20 y.o. G1P0 at [redacted]w[redacted]d admitted for PDIOL, subsequently found to have GHTN. Pregnancy c/b teen pregnancy, anemia (history of sickle cell trait, FOB has not been tested), +chlamydia (treated), abnormal quad screen with increased risks of downs (declined follow up testing)  S: She continues to do well. No current complaints or concerns. Pushing is going well.   Filed Vitals:   08/15/15 1231 08/15/15 1301  BP: 142/86 159/96  Pulse: 75 81  Temp:  97.8 F (36.6 C)  Resp:     FHT: category II for occasional variables, +acels/no late decels, good variability TOCO: IUPC in place SVE: 10/c/+2 SROM 0025  Plan: Labor: pitocin running, currently pushing FWB: category II, no need for intervention at this time for rare variables GBS: Positive, PCN running, adequate treatment

## 2015-08-16 LAB — BASIC METABOLIC PANEL
ANION GAP: 7 (ref 5–15)
BUN: 5 mg/dL — ABNORMAL LOW (ref 6–20)
CHLORIDE: 108 mmol/L (ref 101–111)
CO2: 23 mmol/L (ref 22–32)
Calcium: 8.1 mg/dL — ABNORMAL LOW (ref 8.9–10.3)
Creatinine, Ser: 0.92 mg/dL (ref 0.44–1.00)
GFR calc Af Amer: >60 mL/min (ref >60)
GFR calc non Af Amer: >60 mL/min (ref >60)
GLUCOSE: 92 mg/dL (ref 65–99)
POTASSIUM: 3.5 mmol/L (ref 3.5–5.1)
Sodium: 138 mmol/L (ref 135–145)

## 2015-08-16 LAB — COMPREHENSIVE METABOLIC PANEL
ALBUMIN: 2.4 g/dL — AB (ref 3.5–5.0)
ALT: 9 U/L — AB (ref 14–54)
AST: 29 U/L (ref 15–41)
Alkaline Phosphatase: 99 U/L (ref 38–126)
Anion gap: 5 (ref 5–15)
BUN: 6 mg/dL (ref 6–20)
CHLORIDE: 106 mmol/L (ref 101–111)
CO2: 23 mmol/L (ref 22–32)
CREATININE: 1.03 mg/dL — AB (ref 0.44–1.00)
Calcium: 7.9 mg/dL — ABNORMAL LOW (ref 8.9–10.3)
GFR calc Af Amer: >60 mL/min (ref >60)
GFR calc non Af Amer: >60 mL/min (ref >60)
GLUCOSE: 93 mg/dL (ref 65–99)
POTASSIUM: 3.5 mmol/L (ref 3.5–5.1)
Sodium: 134 mmol/L — ABNORMAL LOW (ref 135–145)
Total Bilirubin: 0.7 mg/dL (ref 0.3–1.2)
Total Protein: 5.5 g/dL — ABNORMAL LOW (ref 6.5–8.1)

## 2015-08-16 LAB — CBC
HEMATOCRIT: 24.1 % — AB (ref 36.0–46.0)
Hemoglobin: 8.3 g/dL — ABNORMAL LOW (ref 12.0–15.0)
MCH: 25.5 pg — ABNORMAL LOW (ref 26.0–34.0)
MCHC: 34.4 g/dL (ref 30.0–36.0)
MCV: 74.2 fL — ABNORMAL LOW (ref 78.0–100.0)
PLATELETS: 222 10*3/uL (ref 150–400)
RBC: 3.25 MIL/uL — AB (ref 3.87–5.11)
RDW: 16 % — AB (ref 11.5–15.5)
WBC: 16.6 10*3/uL — AB (ref 4.0–10.5)

## 2015-08-16 LAB — MAGNESIUM: Magnesium: 5.3 mg/dL — ABNORMAL HIGH (ref 1.7–2.4)

## 2015-08-16 NOTE — Lactation Note (Addendum)
This note was copied from a baby's chart. Lactation Consultation Note  Patient Name: Michelle Calhoun ZOXWR'U Date: 08/16/2015 Reason for consult: Follow-up assessment;Difficult latch  Baby 22 hours old. Demonstrated suck-training for parents. Baby started out with an uncoordinated suck, but after a few minutes of suck training, baby able to suckle well and create a vacuum around finger. Enc mom to offer lots of STS and attempts at the breast. Enc mom to post pump after baby at breast if baby not nursing well. Enc parents to provide suck training just before feeding in order to remind baby how to suckle, and break baby's habit of tongue sucking.   Mom desires to offer breast and formula with bottle, so discussed supply and demand. Also discouraged pacifier use, and discussed the risks to breastfeeding. Mom reports that she may decide not to nurse at all. Discussed the benefits of colostrum and EBM, and the option of pumping and bottle-feeding. Maternal Data Has patient been taught Hand Expression?: Yes Does the patient have breastfeeding experience prior to this delivery?: No  Feeding Feeding Type: Breast Fed Nipple Type: Slow - flow Length of feed: 0 min  LATCH Score/Interventions Latch: Too sleepy or reluctant, no latch achieved, no sucking elicited.  Audible Swallowing: None  Type of Nipple: Everted at rest and after stimulation (short shaft)  Comfort (Breast/Nipple): Soft / non-tender     Hold (Positioning): Assistance needed to correctly position infant at breast and maintain latch. Intervention(s): Breastfeeding basics reviewed;Support Pillows;Position options  LATCH Score: 5  Lactation Tools Discussed/Used Tools: Pump Breast pump type: Double-Electric Breast Pump Pump Review: Setup, frequency, and cleaning;Milk Storage Initiated by:: JW Date initiated:: 08/16/15   Consult Status Consult Status: Follow-up Date: 08/17/15 Follow-up type:  In-patient    Geralynn Ochs 08/16/2015, 1:50 PM

## 2015-08-16 NOTE — Lactation Note (Signed)
This note was copied from a baby's chart. Lactation Consultation Note  Patient Name: Michelle Calhoun NWGNF'[id number] Date: 08/16/2015 Reason for consult: Initial assessment;Difficult latch Baby 21 hours old. Called by CN nurse Alvino Chapel, RN, to report that baby having a hard time latching to breast or taking a bottle nipple. Parents report that baby had 5 ml of formula by bottle an hour ago. Discussed with parents that baby is tongue-sucking and this is why it seems like she is pushing the nipple and bottle teat out of her mouth. Enc parents to perform suck training with the baby. Enc parents to call when baby cues to nurse for the next feeding. Mom reports that she has pumped once, but only got drops. Discussed normal progression of milk coming to volume, and enc mom to pump with each feeding. Mom states that she is comfortable with hand expression. Mom given [facility] brochure, aware of OP/BFSG and LC phone line assistance after D/C.   Maternal Data    Feeding Feeding Type: Bottle Fed - Formula Nipple Type: Slow - flow  LATCH Score/Interventions                      Lactation Tools Discussed/Used     Consult Status Consult Status: Follow-up Date: 08/17/15 Follow-up type: In-patient    Geralynn Ochs 08/16/2015, 1:40 PM

## 2015-08-16 NOTE — Anesthesia Postprocedure Evaluation (Addendum)
Anesthesia Post Note  Patient: Michelle Calhoun  Procedure(s) Performed: * No procedures listed *  Patient location during evaluation: Antenatal Anesthesia Type: Epidural Level of consciousness: awake and alert and oriented Pain management: pain level controlled (patient states pain controlled.) Vital Signs Assessment: post-procedure vital signs reviewed and stable Respiratory status: spontaneous breathing Cardiovascular status: blood pressure returned to baseline Postop Assessment: no headache, no backache, no signs of nausea or vomiting, adequate PO intake and patient able to bend at knees Anesthetic complications: no    Last Vitals:  Filed Vitals:   08/16/15 0600 08/16/15 0700  BP: 141/91 131/85  Pulse: 86 83  Temp:    Resp: 12 12    Last Pain:  Filed Vitals:   08/16/15 0709  PainSc: Asleep                 Ivery Michalski

## 2015-08-16 NOTE — Progress Notes (Signed)
Post Partum Day 1 Subjective: no complaints, up ad lib, voiding and tolerating PO  Objective: Blood pressure 131/85, pulse 83, temperature 97.3 F (36.3 C), temperature source Oral, resp. rate 12, height _0  (1.575 m), weight 150 lb (68.04 kg), last menstrual period 10/19/2014, SpO2 96 %, unknown if currently breastfeeding.  Physical Exam:  General: alert, cooperative and no distress Lochia: appropriate Uterine Fundus: firm DVT Evaluation: No evidence of DVT seen on physical exam. Negative Homan's sign. No cords or calf tenderness.   Recent Labs  08/14/15 0122 08/16/15 0536  HGB 8.8* 8.3*  HCT 25.9* 24.1*   Results for NOLA, BOTKINS (MRN [id number]) as of 08/16/2015 07:30  Ref. Range 08/14/2015 01:22 08/15/2015 16:34 08/16/2015 05:36  Sodium Latest Ref Range: 135-145 mmol/L 136 137 134 (L)  Potassium Latest Ref Range: 3.5-5.1 mmol/L 3.7 3.8 3.5  Chloride Latest Ref Range: 101-111 mmol/L 109 106 106  CO2 Latest Ref Range: 22-32 mmol/L 20 (L) 21 (L) 23  BUN Latest Ref Range: 6-20 mg/dL <5 (L) 7 6  Creatinine Latest Ref Range: 0.44-1.00 mg/dL 0.60 1.70 (H) 1.03 (H)  Calcium Latest Ref Range: 8.9-10.3 mg/dL 8.7 (L) 8.6 (L) 7.9 (L)  EGFR (Non-African Amer.) Latest Ref Range: >60 mL/min >60 43 (L) >60  EGFR (African American) Latest Ref Range: >60 mL/min >60 49 (L) >60  Glucose Latest Ref Range: 65-99 mg/dL 84 93 93  Anion gap Latest Ref Range: 5-_1 Assessment/Plan: Plan for discharge tomorrow and Lactation consult  Creatinine improving. Continue mag x 24 hours past delivery Repeat BMP at 5pm    LOS: 2 days   STINSON, JACOB JEHIEL 08/16/2015, 7:35 AM

## 2015-08-17 LAB — COMPREHENSIVE METABOLIC PANEL
ALBUMIN: 2.4 g/dL — AB (ref 3.5–5.0)
ALK PHOS: 93 U/L (ref 38–126)
ALT: 11 U/L — ABNORMAL LOW (ref 14–54)
ANION GAP: 4 — AB (ref 5–15)
AST: 25 U/L (ref 15–41)
BUN: 5 mg/dL — ABNORMAL LOW (ref 6–20)
CALCIUM: 8.3 mg/dL — AB (ref 8.9–10.3)
CO2: 27 mmol/L (ref 22–32)
Chloride: 106 mmol/L (ref 101–111)
Creatinine, Ser: 0.86 mg/dL (ref 0.44–1.00)
GFR calc Af Amer: >60 mL/min (ref >60)
GFR calc non Af Amer: >60 mL/min (ref >60)
GLUCOSE: 84 mg/dL (ref 65–99)
Potassium: 4.1 mmol/L (ref 3.5–5.1)
SODIUM: 137 mmol/L (ref 135–145)
Total Bilirubin: 0.4 mg/dL (ref 0.3–1.2)
Total Protein: 5.7 g/dL — ABNORMAL LOW (ref 6.5–8.1)

## 2015-08-17 LAB — CBC
HEMATOCRIT: 22.5 % — AB (ref 36.0–46.0)
HEMOGLOBIN: 7.7 g/dL — AB (ref 12.0–15.0)
MCH: 25.6 pg — AB (ref 26.0–34.0)
MCHC: 34.2 g/dL (ref 30.0–36.0)
MCV: 74.8 fL — AB (ref 78.0–100.0)
Platelets: 234 10*3/uL (ref 150–400)
RBC: 3.01 MIL/uL — ABNORMAL LOW (ref 3.87–5.11)
RDW: 16.3 % — ABNORMAL HIGH (ref 11.5–15.5)
WBC: 9.3 10*3/uL (ref 4.0–10.5)

## 2015-08-17 MED ORDER — PANTOPRAZOLE SODIUM 20 MG PO TBEC
20.0000 mg | DELAYED_RELEASE_TABLET | Freq: Every day | ORAL | Status: DC
  Administered 2015-08-17 – 2015-08-18 (×2): 20 mg via ORAL
  Filled 2015-08-17 (×3): qty 1

## 2015-08-17 MED ORDER — HYDROCHLOROTHIAZIDE 12.5 MG PO CAPS
12.5000 mg | ORAL_CAPSULE | Freq: Every day | ORAL | Status: DC
  Administered 2015-08-17 – 2015-08-18 (×2): 12.5 mg via ORAL
  Filled 2015-08-17 (×3): qty 1

## 2015-08-17 MED ORDER — ALUM & MAG HYDROXIDE-SIMETH 200-200-20 MG/5ML PO SUSP
30.0000 mL | ORAL | Status: DC | PRN
  Administered 2015-08-17 – 2015-08-18 (×3): 30 mL via ORAL
  Filled 2015-08-17 (×3): qty 30

## 2015-08-17 MED ORDER — GI COCKTAIL ~~LOC~~
30.0000 mL | Freq: Once | ORAL | Status: AC
  Administered 2015-08-17: 30 mL via ORAL
  Filled 2015-08-17 (×2): qty 30

## 2015-08-17 MED ORDER — SODIUM CHLORIDE 0.9 % IV SOLN
510.0000 mg | Freq: Once | INTRAVENOUS | Status: AC
  Administered 2015-08-17: 510 mg via INTRAVENOUS
  Filled 2015-08-17: qty 17

## 2015-08-17 MED ORDER — AMLODIPINE BESYLATE 5 MG PO TABS
5.0000 mg | ORAL_TABLET | Freq: Every day | ORAL | Status: DC
  Administered 2015-08-17 – 2015-08-18 (×2): 5 mg via ORAL
  Filled 2015-08-17 (×3): qty 1

## 2015-08-17 NOTE — Lactation Note (Signed)
This note was copied from a baby's chart. While in room doing baby's assessment, Mom informed RN that she wanted to exclusively bottle feed and no longer wanted to try and breastfeed her baby or pump.  I informed her of breast care for non-breastfeeding mothers.

## 2015-08-17 NOTE — Progress Notes (Signed)
POSTPARTUM PROGRESS NOTE  Post Partum Day 2 Subjective:  Michelle Calhoun is a 20 y.o. G1P1001 [redacted]w[redacted]d s/p nsvd. Says that since Saturday at night will experience episodes of epigastric fullness/burning, with shortness of breath. Relieved with sitting up.    Pt denies problems with ambulating, voiding or po intake.  She denies nausea or vomiting.  Pain is well controlled.  She has had flatus. She has not had bowel movement.  Lochia Small.   Objective: Blood pressure 153/95, pulse 69, temperature 97.6 F (36.4 C), temperature source Oral, resp. rate 18, height  (1.575 m), weight 150 lb (68.04 kg), last menstrual period 10/19/2014, SpO2 100 %, unknown if currently breastfeeding.  Physical Exam:  General: alert, cooperative and no distress Lochia:normal flow Chest: CTAB, no tachypnea Heart: RRR no m/r/g Abdomen: +BS, soft, nontender,  Uterine Fundus: firm,  DVT Evaluation: No calf swelling or tenderness Extremities: no edema, no calf swelling or tenderness   Recent Labs  08/16/15 0536  HGB 8.3*  HCT 24.1*    Assessment/Plan:  ASSESSMENT: Michelle Calhoun is a 20 y.o. G1P1001 [redacted]w[redacted]d s/p nsvd, complicated by preE w/ severe features, now s/p 24 hours pp mg. Think epigastric pain most likely GERD given location, quality, and association with lying down relieved with sitting up. O2 is always 100% or near it, and no tachypnea, and lungs clear today, so think PE or pneumonia or pneumothorax are much less likely. Pulmonary edema can be a complication of severe preeclampsia, but would expect a lower O2 sat and/or tachypnea. Will give GI cocktail and re-check HELLP labs this morning. Starting hctz 12.5 for persistent moderate BP elevations. Plan for d/c later today   LOS: 3 days   Silvano Bilis 08/17/2015, 6:33 AM

## 2015-08-17 NOTE — Progress Notes (Signed)
Patient called out reporting chest pounding/tightness. Oxygen saturation at 100%. Pulse 70. BP 139/89. Patient applied 2 L of oxygen via nasal cannula. Updated Dr. Ashok Pall and Dr. Ottie Glazier. Patient reports that this has been intermittently ongoing even during her pregnancy. Will continue to monitor. Irving Burton Kaycen Whitworth RN

## 2015-08-18 LAB — PROTEIN / CREATININE RATIO, URINE
CREATININE, URINE: 32 mg/dL
Protein Creatinine Ratio: 0.22 mg/mg{Cre} — ABNORMAL HIGH (ref 0.00–0.15)
Total Protein, Urine: 7 mg/dL

## 2015-08-18 MED ORDER — IBUPROFEN 600 MG PO TABS: 600.0000 mg | ORAL_TABLET | Freq: Four times a day (QID) | ORAL | Status: DC

## 2015-08-18 MED ORDER — AMLODIPINE BESYLATE 5 MG PO TABS: 5.0000 mg | ORAL_TABLET | Freq: Every day | ORAL | Status: DC

## 2015-08-18 MED ORDER — TRIAMTERENE-HCTZ 37.5-25 MG PO CAPS: 1.0000 | ORAL_CAPSULE | Freq: Every day | ORAL | Status: DC

## 2015-08-18 NOTE — Lactation Note (Signed)
This note was copied from a baby's chart. Lactation Consultation Note  Patient Name: Michelle Calhoun ZOXWR'U Date: 08/18/2015 Reason for consult: Follow-up assessment    With this mom and baby, now 37 hours old, and full term. Mom called for help with latching baby. Mom was using football hold. I showed mom how to better position the baby with a  pillow. The baby is on phototherapy, which also makes latching more difficult, and baby being sleepy from increased bili.  Mom has nipple shield in place, and I assisted her [placing SNS with EBM under the shield. I showed dad how to assist mom with slowly pushing milk into shiied. The baby bean vigorously sucking once milk in shied. She took 6 ml's of EBM, and then baby latched without SNS, and continued with feeding.  Mom frustrated. I told her that her 2 basic jobs now were to protect her milk supply, and feed the baby. I explained that supplementation does not have to be at the breast each time, and that mom could use a bottle, since the nipple shield is much like a bottle. Mom knows to pump 8-12 times a day. I told her that breast feeding should get better when the baby is no longer on phototherapy, and she is more awake and alert. Mom doing well, and seems to appreciated support. Mom knows to call for questions/conerns.    Maternal Data    Feeding Feeding Type: Breast Fed Length of feed: 10 min  LATCH Score/Interventions Latch: Repeated attempts needed to sustain latch, nipple held in mouth throughout feeding, stimulation needed to elicit sucking reflex. (bab would not latch without nipple shiled, but lataches well with shield) Intervention(s): Waking techniques Intervention(s): Adjust position;Assist with latch;Breast compression  Audible Swallowing: A few with stimulation Intervention(s): Skin to skin;Hand expression  Type of Nipple: Flat (everted some, very slft breast tissue, baby used to ffeling nipple shield) Intervention(s):  Double electric pump  Comfort (Breast/Nipple): Soft / non-tender     Hold (Positioning): Assistance needed to correctly position infant at breast and maintain latch. Intervention(s): Breastfeeding basics reviewed;Support Pillows;Position options;Skin to skin  LATCH Score: 6  Lactation Tools Discussed/Used Tools: Pump Breast pump type: Double-Electric Breast Pump   Consult Status Consult Status: Follow-up Date: 08/19/15 Follow-up type: In-patient    Alfred Levins 08/18/2015, 2:02 PM

## 2015-08-18 NOTE — Discharge Summary (Signed)
OB Discharge Summary     Patient Name: Michelle Calhoun DOB: October 03, 1995 MRN: [id number]  Date of admission: 08/14/2015 Delivering MD: Levie Heritage   Date of discharge: 08/18/2015  Admitting diagnosis: 41wks, Induction Intrauterine pregnancy: [redacted]w[redacted]d     Secondary diagnosis:  Active Problems:   Post term pregnancy over 40 weeks  Additional problems:      Discharge diagnosis: Term Pregnancy Delivered                                                                                                Post partum procedures:none  Augmentation: Pitocin, Cytotec and Foley Balloon  Complications: None  Hospital course:  Induction of Labor With Vaginal Delivery   20 y.o. yo G1P1001 at [redacted]w[redacted]d was admitted to the hospital 08/14/2015 for induction of labor.  Indication for induction: Postdates.  Patient had an uncomplicated labor course as follows: Membrane Rupture Time/Date: 12:25 AM ,08/15/2015   Intrapartum Procedures: Episiotomy: None [1]                                         Lacerations:  1st degree [2];Labial [10]  Patient had delivery of a Viable infant.  Information for the patient's newborn:  Gibson Ramp [[id number]]  Delivery Method: Vaginal, Spontaneous Delivery (Filed from Delivery Summary)   08/15/2015  Details of delivery can be found in separate delivery note.  Patient had a routine postpartum course. Patient is discharged home 08/24/2015.   Physical exam  Filed Vitals:   08/18/15 0739 08/18/15 1105 08/18/15 1106 08/18/15 1202  BP: 148/101 146/99 134/97 150/102  Pulse: 72 68 87 84  Temp:    98.1 F (36.7 C)  TempSrc:    Oral  Resp:  18  16  Height:      Weight:      SpO2:  100%     General: alert, cooperative and no distress Lochia: appropriate Uterine Fundus: firm Incision: N/A DVT Evaluation: No evidence of DVT seen on physical exam. Labs: Lab Results  Component Value Date   WBC 9.3 08/17/2015   HGB 7.7* 08/17/2015   HCT 22.5* 08/17/2015   MCV 74.8* 08/17/2015   PLT 234 08/17/2015   CMP Latest Ref Rng 08/17/2015  Glucose 65 - 99 mg/dL 84  BUN 6 - 20 mg/dL 5(L)  Creatinine 1.02 - 1.00 mg/dL 7.25  Sodium 366 - 440 mmol/L 137  Potassium 3.5 - 5.1 mmol/L 4.1  Chloride 101 - 111 mmol/L 106  CO2 22 - 32 mmol/L 27  Calcium 8.9 - 10.3 mg/dL 8.3(L)  Total Protein 6.5 - 8.1 g/dL 3.4(V)  Total Bilirubin 0.3 - 1.2 mg/dL 0.4  Alkaline Phos 38 - 126 U/L 93  AST 15 - 41 U/L 25  ALT 14 - 54 U/L 11(L)    Discharge instruction: per After Visit Summary and "Baby and Me Booklet".  After visit meds:    Medication List    STOP taking these medications        omeprazole  20 MG capsule  Commonly known as:  PRILOSEC      TAKE these medications        amLODipine 5 MG tablet  Commonly known as:  NORVASC  Take 1 tablet (5 mg total) by mouth daily.     diphenhydrAMINE 25 MG tablet  Commonly known as:  BENADRYL  Take 25 mg by mouth every 6 (six) hours as needed for allergies or sleep.     docusate sodium 100 MG capsule  Commonly known as:  COLACE  Take 1 capsule (100 mg total) by mouth 2 (two) times daily.     ibuprofen 600 MG tablet  Commonly known as:  ADVIL,MOTRIN  Take 1 tablet (600 mg total) by mouth every 6 (six) hours.     prenatal multivitamin Tabs tablet  Take 1 tablet by mouth daily at 12 noon.     triamterene-hydrochlorothiazide 37.5-25 MG capsule  Commonly known as:  DYAZIDE  Take 1 each (1 capsule total) by mouth daily.        Diet: routine diet  Activity: Advance as tolerated. Pelvic rest for 6 weeks.   Outpatient follow up:6 weeks Follow up Appt:No future appointments. Follow up Visit:No Follow-up on file.  Postpartum contraception: Nexplanon  Newborn Data: Live born female  Birth Weight: 7 lb 0.9 oz (3200 g) APGAR: 2, 6  Baby Feeding: Bottle Disposition:home with mother   08/18/2015 Lazaro Arms, MD

## 2015-08-18 NOTE — Lactation Note (Signed)
This note was copied from a baby's chart. Lactation Consultation Note  Patient Name: Michelle Calhoun ZOXWR'U Date: 08/18/2015 Reason for consult: Follow-up assessment;Difficult latch;Hyperbilirubinemia Mom pumping with DEBP when LC arrived, obtaining approx 4 ml of colostrum. Baby giving feeding ques. Assisted Mom to latch baby keeping 1 bili blanket on with nursing. Mom using #16 nipple shield but nipple shield not staying on Mom's breast well. After few attempts baby was able to latch without nipple shield using breast compression. Baby nursed with good suckling bursts, few noted swallows. Was going to demonstrate to Mom how to supplement at breast using 5 fr feeding tube/syringe but baby fell asleep. Discussed feeding plan with Mom. Encouraged to BF with each feeding, keeping 1 bili blanket on baby, limit time at breast to 30 minutes due to photo therapy. Call for assist with next feeding to demonstrate how to use 5 fr feeding tube at breast for supplements and to assist Mom with breast compression to latch without nipple shield. Encouraged Mom to continue to post pump for 15 minutes and supplement till baby latching consistently. Advised baby should be at the breast 8-12 times in 24 hours. Mom to call for assist.   Maternal Data    Feeding Feeding Type: Breast Fed Length of feed: 10 min  LATCH Score/Interventions Latch: Repeated attempts needed to sustain latch, nipple held in mouth throughout feeding, stimulation needed to elicit sucking reflex. Intervention(s): Adjust position;Assist with latch;Breast massage;Breast compression  Audible Swallowing: A few with stimulation  Type of Nipple: Everted at rest and after stimulation (short nipple shafts bilateral)  Comfort (Breast/Nipple): Soft / non-tender     Hold (Positioning): Assistance needed to correctly position infant at breast and maintain latch. Intervention(s): Breastfeeding basics reviewed;Support Pillows;Position  options;Skin to skin  LATCH Score: 7  Lactation Tools Discussed/Used Tools: Pump Breast pump type: Double-Electric Breast Pump   Consult Status Consult Status: Follow-up Date: 08/18/15 Follow-up type: In-patient    Alfred Levins 08/18/2015, 10:37 AM

## 2015-08-18 NOTE — Discharge Instructions (Signed)

## 2015-08-18 NOTE — Progress Notes (Signed)
Patient understands discharge instructions her significant other will pick up her prescriptions.

## 2015-08-19 ENCOUNTER — Ambulatory Visit: Payer: Self-pay

## 2015-08-19 NOTE — Lactation Note (Signed)
This note was copied from a baby's chart. Lactation Consultation Note  Patient Name: Michelle Calhoun ZOXWR'U Date: 08/19/2015 Reason for consult: Follow-up assessment;Hyperbilirubinemia   Follow up with mom of 91 hour old infant. Infant with 6 BF for 10-30 minutes, 2 attempts,3  EBM Supplementation via bottle of 6-10 cc, 2 formula supplementation via bottle of 10 & 44 cc, 4 voids and 0 stools in last 24 hours. Last stool on flow sheet and per mom was 0020 on 2/15. Infant is passing gas per mom. LATCH Scores 7-10 by bedside RN. Phototherapy has been d/c.   Plan is for infant to be d/c home today with follow up with Ped tomorrow morning. Mom reports she prefers to pump and bottle feed. She reports she is pumping about every 4 hours. Discussed supply and demand with mom and need to stimulate breast frequently to initiate and maintain a milk supply. She has history of Hgb of 7.7 requiring blood transfusion. She reports she does not feel full today. She notes she can hear swallows when infant feeds at breast.   Mom requesting pump rental. She is a [facility] client and has a follow up appt on Wed. Pump Referral filled out and faxed to [facility]. Pump rental papers given, mom to call for pump rental prior to leaving hospital, she has [facility] phone #.   Mom asking about switching over to formula and asking several questions about formula amounts, type, and preparation. Formula Preparation/amounts handouts given and explained to mom. Infant took 44 cc this am and mom was surprised, showed her the amount is wnl per day of age. Enc mom to follow formula amount sheets using EBM as available and adding formula if EBM not available.   Reviewed Engorgement prevention with mom. Reviewed all BF information in Taking Care of Baby and Me Booklet. Reviewed LC Brochure and LC Phone #. Enc mom to call with questions/concerns prn.      Maternal Data Formula Feeding for Exclusion: No Reason for  exclusion: Mother's choice to formula and breast feed on admission Does the patient have breastfeeding experience prior to this delivery?: No  Feeding Feeding Type: Breast Fed  LATCH Score/Interventions                      Lactation Tools Discussed/Used WIC Program: Yes Pump Review: Setup, frequency, and cleaning;Milk Storage   Consult Status Consult Status: Complete Date: 08/19/15 Follow-up type: Call as needed    Ed Blalock 08/19/2015, 10:47 AM

## 2016-07-24 ENCOUNTER — Encounter (HOSPITAL_COMMUNITY): Payer: Self-pay

## 2016-07-24 ENCOUNTER — Emergency Department (HOSPITAL_COMMUNITY)
Admission: EM | Admit: 2016-07-24 | Discharge: 2016-07-24 | Disposition: A | Discharge status: Home / Self Care | Payer: Medicaid Other | Attending: Emergency Medicine | Admitting: Emergency Medicine

## 2016-07-24 DIAGNOSIS — R0981 Nasal congestion: Secondary | ICD-10-CM | POA: Insufficient documentation

## 2016-07-24 DIAGNOSIS — Z79899 Other long term (current) drug therapy: Secondary | ICD-10-CM | POA: Diagnosis not present

## 2016-07-24 DIAGNOSIS — Z5321 Procedure and treatment not carried out due to patient leaving prior to being seen by health care provider: Secondary | ICD-10-CM | POA: Insufficient documentation

## 2016-07-24 DIAGNOSIS — J029 Acute pharyngitis, unspecified: Secondary | ICD-10-CM | POA: Insufficient documentation

## 2016-07-24 DIAGNOSIS — J45909 Unspecified asthma, uncomplicated: Secondary | ICD-10-CM | POA: Diagnosis not present

## 2016-07-24 LAB — RAPID STREP SCREEN (MED CTR MEBANE ONLY): Streptococcus, Group A Screen (Direct): NEGATIVE

## 2016-07-24 NOTE — ED Notes (Addendum)
Pt stated she did not want to wait any longer. Pt stated she would be going to urgent care in the morning. Pt walked out of Emergency room doors.

## 2016-07-24 NOTE — ED Triage Notes (Signed)
Pt states she started having cough and congestion on Saturday with sore throat; pt states she took OTC with no relief; Pt a&ox 4 on arrival.

## 2016-07-26 LAB — CULTURE, GROUP A STREP (THRC)

## 2016-12-01 IMAGING — CR DG CHEST 2V
2 series · 2 of 2 positions shown · non-contrast
Comparison: Chest radiograph July 02, 2014

CLINICAL DATA: Acute onset mid sternal chest pain this morning
while eating honey BBQ ribs. Nausea and headache. Similar symptoms 1
month ago.

EXAM:
CHEST  2 VIEW

[w chest pa]
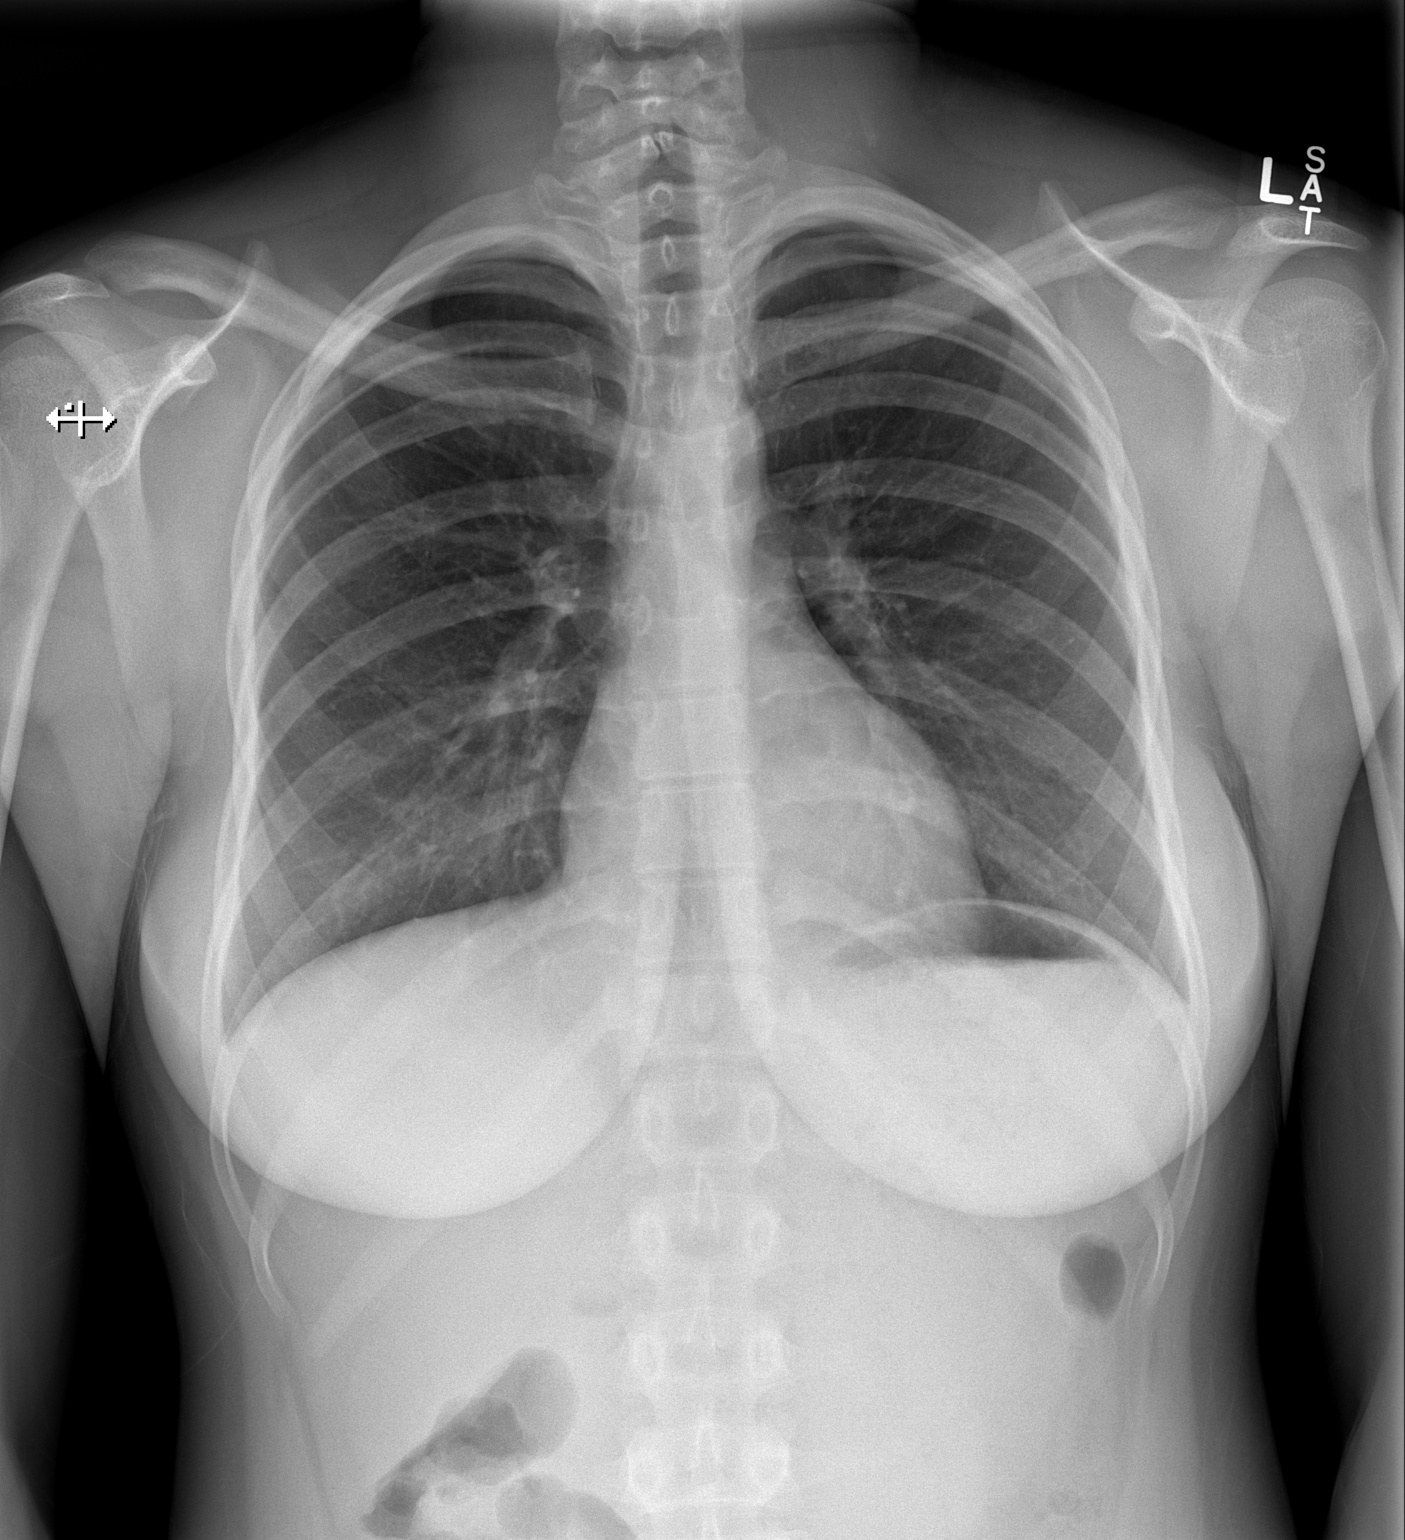

[w chest lat]
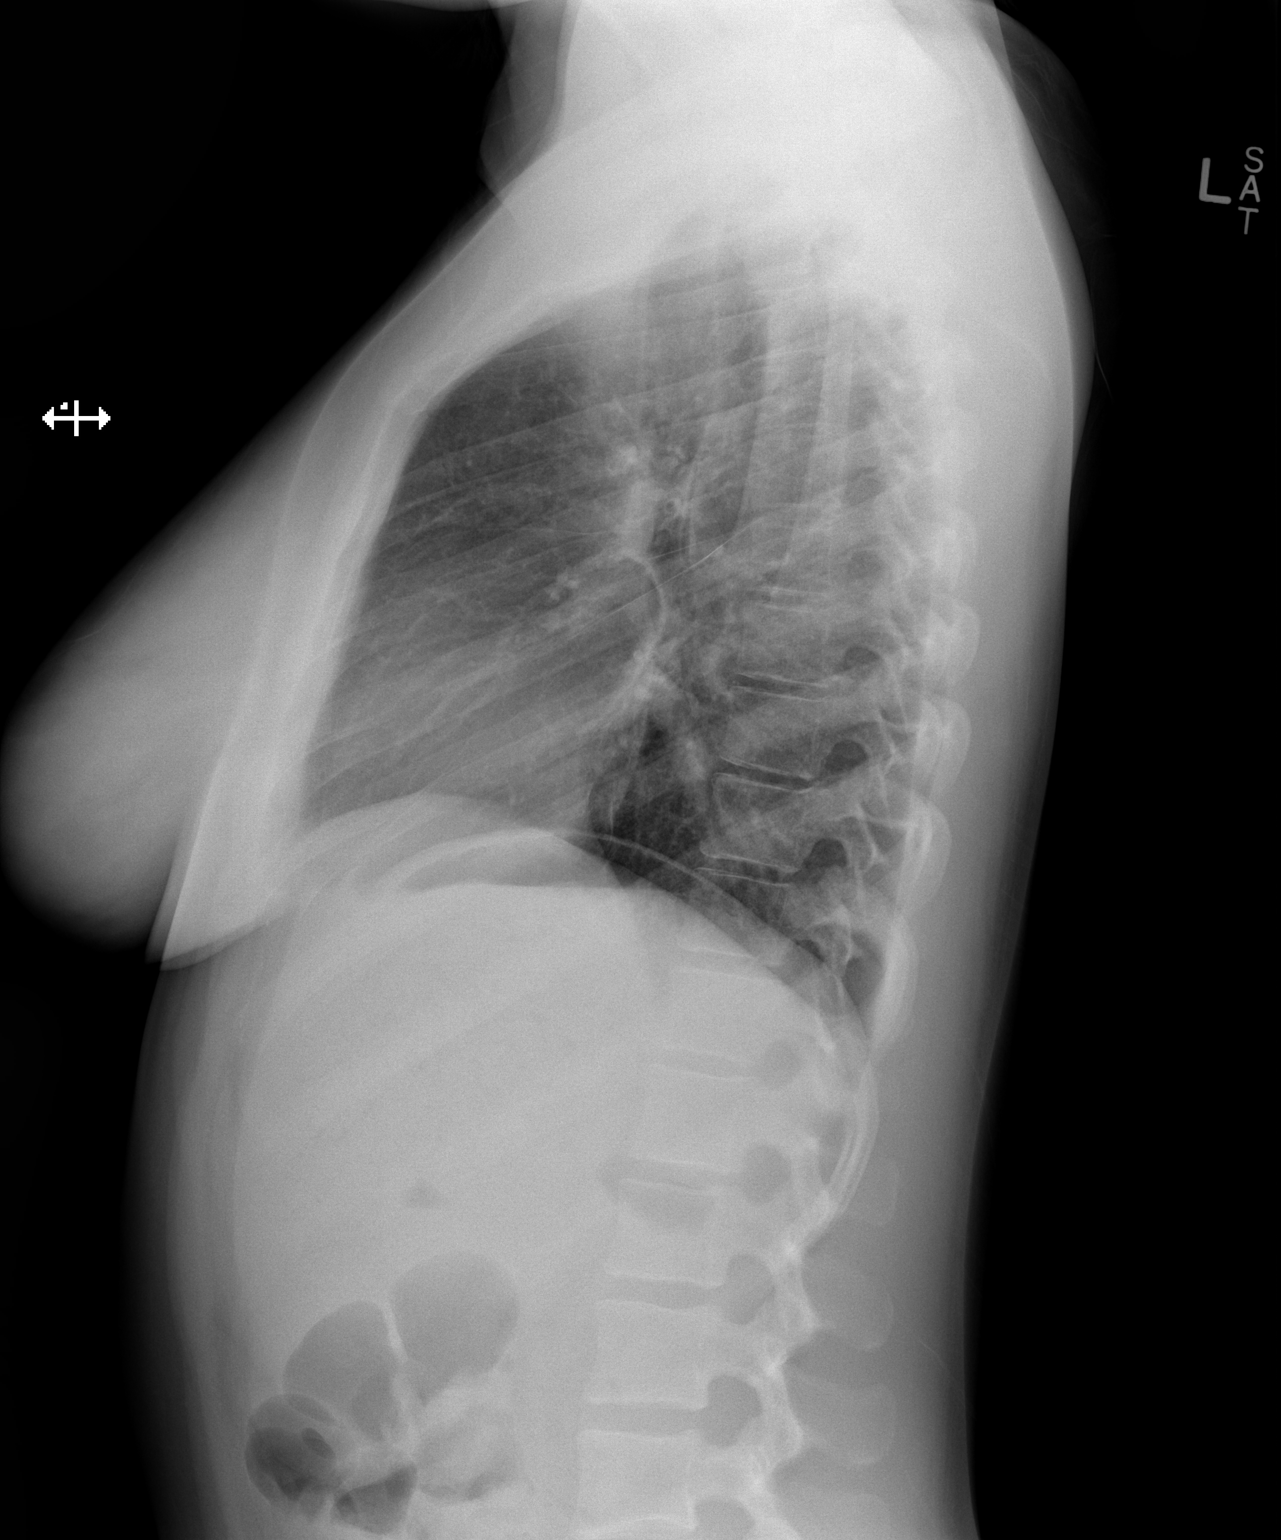

[2 of 2 positions shown; findings below may reference images not displayed]

FINDINGS: Cardiomediastinal silhouette is unremarkable. The lungs are clear
without pleural effusions or focal consolidations. Trachea projects
midline and there is no pneumothorax. Soft tissue planes and
included osseous structures are non-suspicious. T1 spinous processes
congenitally unfused.
IMPRESSION: Normal chest.

  By: Moesha Hibbard

## 2017-07-24 ENCOUNTER — Emergency Department (HOSPITAL_COMMUNITY)
Admission: EM | Admit: 2017-07-24 | Discharge: 2017-07-25 | Disposition: A | Discharge status: Home / Self Care | Payer: Self-pay | Attending: Emergency Medicine | Admitting: Emergency Medicine

## 2017-07-24 ENCOUNTER — Encounter (HOSPITAL_COMMUNITY): Payer: Self-pay | Admitting: Emergency Medicine

## 2017-07-24 DIAGNOSIS — M25561 Pain in right knee: Secondary | ICD-10-CM | POA: Insufficient documentation

## 2017-07-24 DIAGNOSIS — Z79899 Other long term (current) drug therapy: Secondary | ICD-10-CM | POA: Insufficient documentation

## 2017-07-24 DIAGNOSIS — J45909 Unspecified asthma, uncomplicated: Secondary | ICD-10-CM | POA: Insufficient documentation

## 2017-07-24 NOTE — ED Triage Notes (Signed)
Pt c/o knee pain that started yesterday. Denies injury or fall. No obvious swelling noted. Pt ambulatory to triage.

## 2017-07-25 NOTE — Discharge Instructions (Signed)

## 2017-07-25 NOTE — ED Provider Notes (Signed)
[facility] EMERGENCY DEPARTMENT Provider Note   CSN: [id number] Arrival date & time: 07/24/17  2301     History   Chief Complaint Chief Complaint  Patient presents with  . Knee Pain    HPI Michelle Calhoun is a 22 y.o. female who presents today for evaluation of acute onset right knee pain.  She reports that yesterday while she was rocking her baby she felt a pop in her right knee and has had significant pain since.  She denies any fevers or chills, no rashes or impacts/trauma.  She reports that she has tried Tylenol for her pain without significant relief.  She reports that she is able to walk, however it is painful.  HPI  Past Medical History:  Diagnosis Date  . Asthma   . Hx of chlamydia infection   . Medical history non-contributory   . Vaginal Pap smear, abnormal     Patient Active Problem List   Diagnosis Date Noted  . Post term pregnancy over 40 weeks 08/14/2015  . Abnormal maternal serum screening test 03/28/2015  . Chlamydia infection affecting pregnancy 03/28/2015    Past Surgical History:  Procedure Laterality Date  . NO PAST SURGERIES      OB History    Gravida Para Term Preterm AB Living   1 1 1     1    SAB TAB Ectopic Multiple Live Births         0 1       Home Medications    Prior to Admission medications   Medication Sig Start Date End Date Taking? Authorizing Provider  amLODipine (NORVASC) 5 MG tablet Take 1 tablet (5 mg total) by mouth daily. 08/18/15   Lazaro Arms, MD  diphenhydrAMINE (BENADRYL) 25 MG tablet Take 25 mg by mouth every 6 (six) hours as needed for allergies or sleep.    [provider]  docusate sodium (COLACE) 100 MG capsule Take 1 capsule (100 mg total) by mouth 2 (two) times daily. Patient not taking: Reported on 08/14/2015 05/20/15   Melancon, Hillery Hunter, MD  ibuprofen (ADVIL,MOTRIN) 600 MG tablet Take 1 tablet (600 mg total) by mouth every 6 (six) hours. 08/18/15   Lazaro Arms, MD  Prenatal  Vit-Fe Fumarate-FA (PRENATAL MULTIVITAMIN) TABS tablet Take 1 tablet by mouth daily at 12 noon.    [provider]  triamterene-hydrochlorothiazide (DYAZIDE) 37.5-25 MG capsule Take 1 each (1 capsule total) by mouth daily. 08/18/15   Lazaro Arms, MD    Family History Family History  Problem Relation Age of Onset  . Asthma Brother   . Hypertension Maternal Grandmother   . Alcohol abuse Neg Hx   . Arthritis Neg Hx   . Birth defects Neg Hx   . Cancer Neg Hx   . COPD Neg Hx   . Depression Neg Hx   . Diabetes Neg Hx   . Drug abuse Neg Hx   . Early death Neg Hx   . Hearing loss Neg Hx   . Hyperlipidemia Neg Hx   . Heart disease Neg Hx   . Kidney disease Neg Hx   . Learning disabilities Neg Hx   . Mental illness Neg Hx   . Mental retardation Neg Hx   . Miscarriages / Stillbirths Neg Hx   . Stroke Neg Hx   . Vision loss Neg Hx   . Varicose Veins Neg Hx     Social History Social History   Tobacco Use  .  Smoking status: Never Smoker  . Smokeless tobacco: Never Used  Substance Use Topics  . Alcohol use: No    Comment: denies 08/28/14  . Drug use: No     Allergies   Patient has no known allergies.   Review of Systems Review of Systems  Constitutional: Negative for chills and fever.  Musculoskeletal: Positive for arthralgias. Negative for back pain, gait problem, joint swelling and myalgias.     Physical Exam Updated Vital Signs BP 130/83 (BP Location: Right Arm)   Pulse (!) 107   Temp 98.3 F (36.8 C) (Oral)   Resp 16   SpO2 100%   Physical Exam  Constitutional: She appears well-developed and well-nourished.  Cardiovascular:  2+ DP/PT pulses  bilateral lower extremities.  Musculoskeletal:  Right knee: There is diffuse pain along bilateral joint lines, worse on medial than lateral.  Patient is able to fully extend her knee, however reports that it hurts to bend it.  Limited flexion.  Knee is grossly stable to valgus/varus stress, along with  anterior/posterior drawer test.  Neurological:  Sensation intact to bilateral lower extremities.  Skin: She is not diaphoretic.  The skin around the right knee is without wounds, no abnormal erythema.  Psychiatric: She has a normal mood and affect. Her behavior is normal.  Nursing note and vitals reviewed.    ED Treatments / Results  Labs (all labs ordered are listed, but only abnormal results are displayed) Labs Reviewed - No data to display  EKG  EKG Interpretation None       Radiology No results found.  Procedures Procedures (including critical care time)  Medications Ordered in ED Medications - No data to display   Initial Impression / Assessment and Plan / ED Course  I have reviewed the triage vital signs and the nursing notes.  Pertinent labs & imaging results that were available during my care of the patient were reviewed by me and considered in my medical decision making (see chart for details).    Pt unable to perform full flexion of the knee.  Pt is without systemic symptoms, erythema or redness of the joint consistent with gout or septic joint.  Patient was offered x-rays, however declined x-rays due to the lack of impact trauma that she has any broken bones.  He is without deformities or crepitus.  I do not suspect true dislocation.  . Pt advised to follow up with orthopedics if symptoms persist for further evaluation and treatment. Patient given brace and crutches while in ED, conservative therapy recommended and discussed. Patient will be dc home & is agreeable with above plan.    Final Clinical Impressions(s) / ED Diagnoses   Final diagnoses:  Acute pain of right knee    ED Discharge Orders    None       Norman ClayHammond, Darwin Guastella W, PA-C 07/25/17 [id number]    Maia PlanLong, Joshua G, MD 07/25/17 [phone]

## 2017-07-25 NOTE — Progress Notes (Signed)
Orthopedic Tech Progress Note Patient Details:  Michelle Calhoun 1995/11/24 [id number]  Ortho Devices Type of Ortho Device: Knee Immobilizer, Crutches Ortho Device/Splint Location: rle Ortho Device/Splint Interventions: Ordered, Application, Adjustment   Post Interventions Patient Tolerated: Well Instructions Provided: Care of device, Adjustment of device   Trinna PostMartinez, Anatalia Kronk J 07/25/2017, 1:21 AM

## 2017-10-16 IMAGING — US US MFM OB FOLLOW-UP
1 series · 14 of 28 positions shown · non-contrast
Comparison: none

[Series 1: us mfm ob follow-up · 33 acquisitions, 14 frames shown]
[im 2/33]
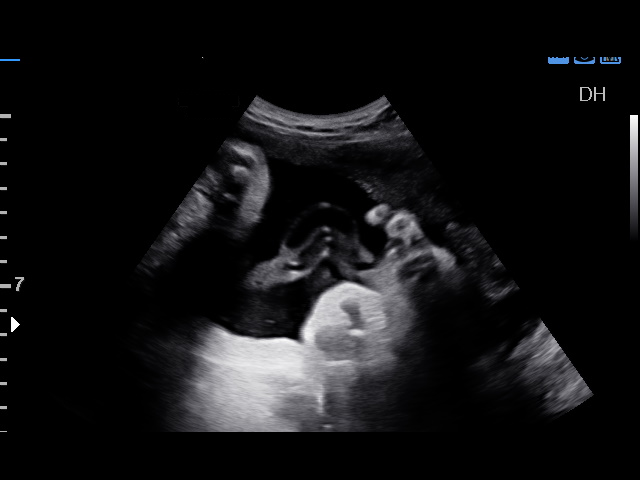
[im 4/33]
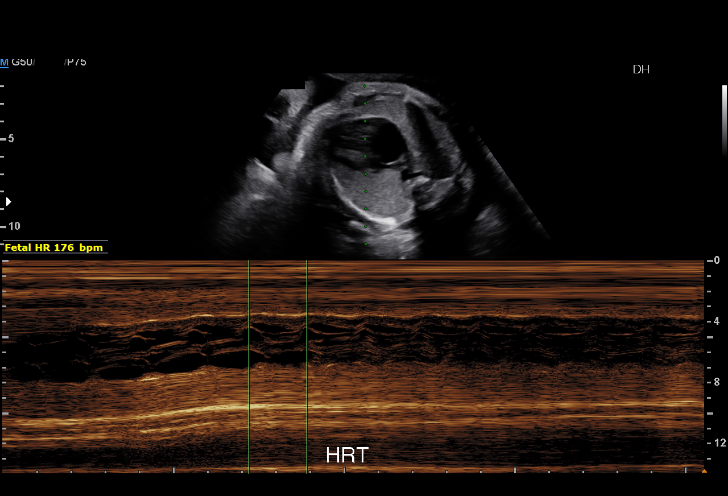
[im 6/33]
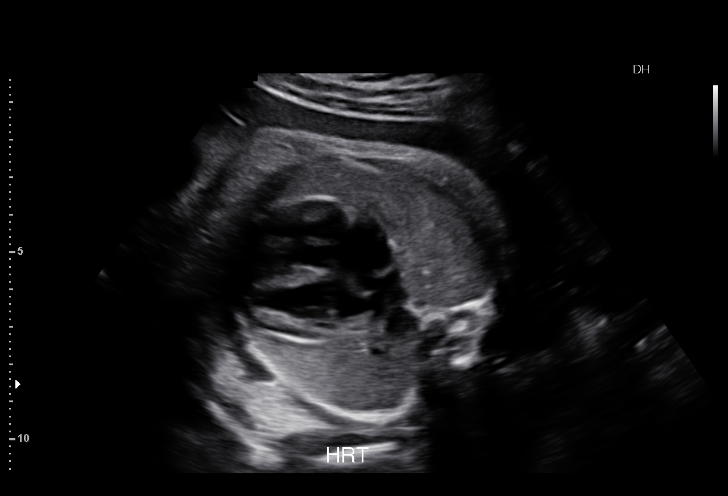
[im 9/33]
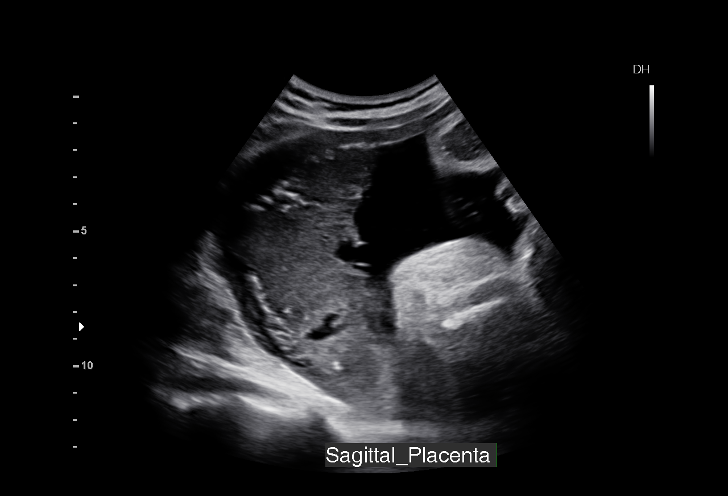
[im 11/33]
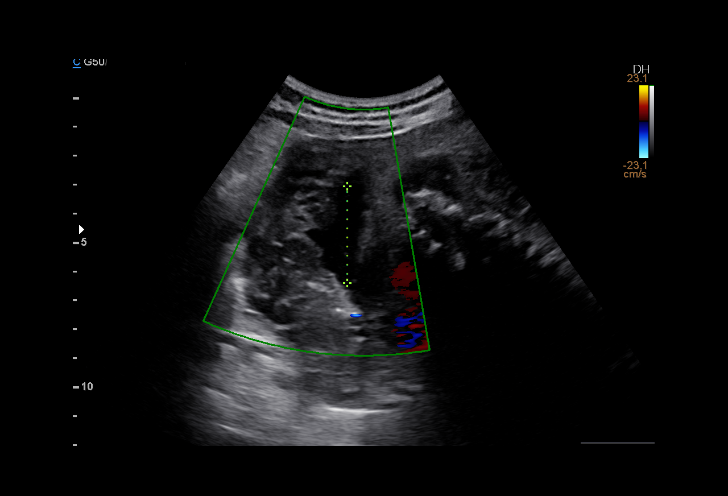
[im 14/33]
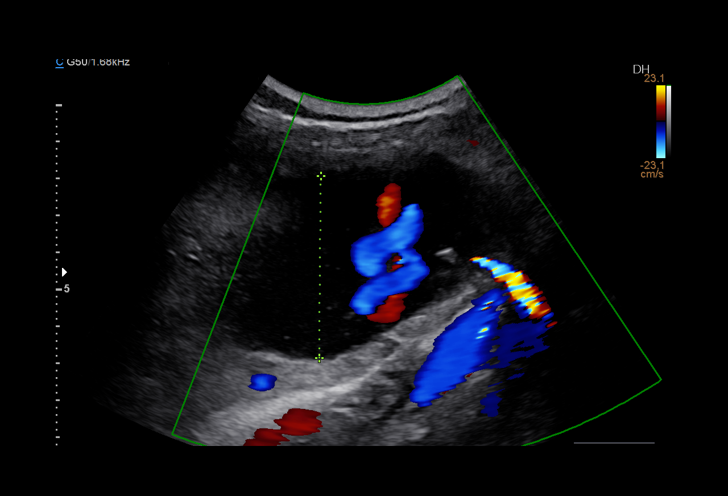
[im 16/33]
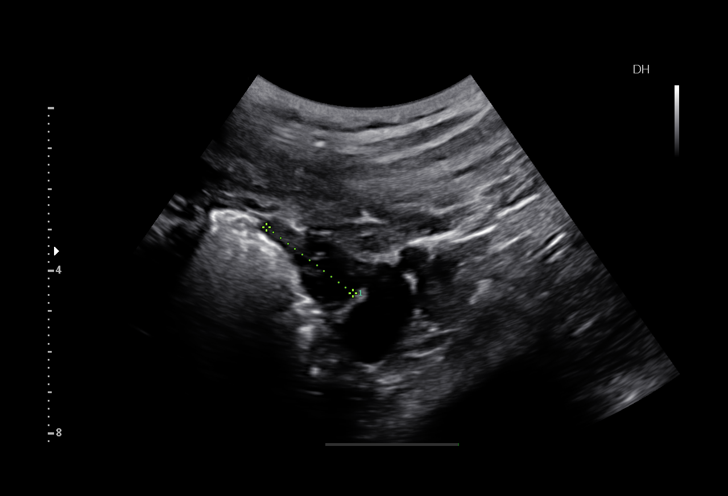
[im 18/33]
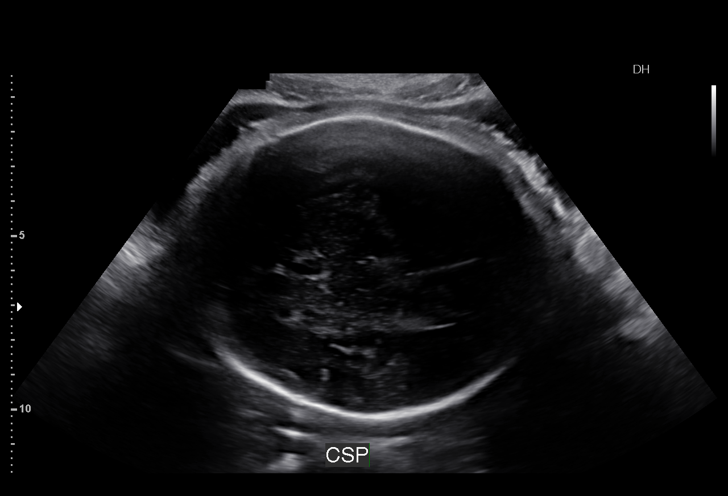
[im 21/33]
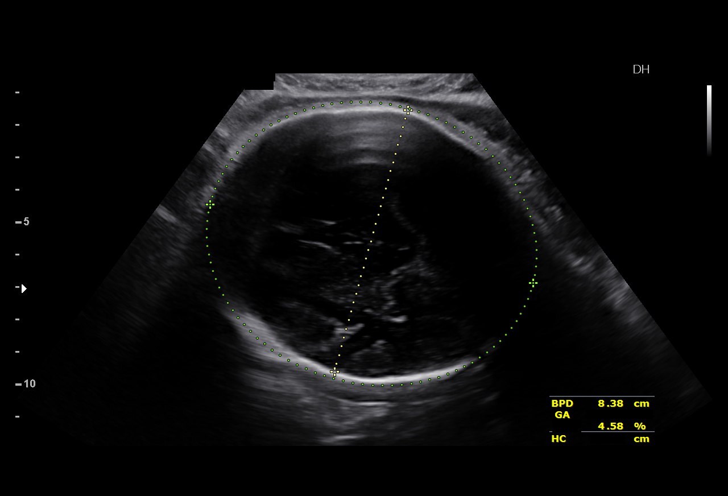
[im 23/33]
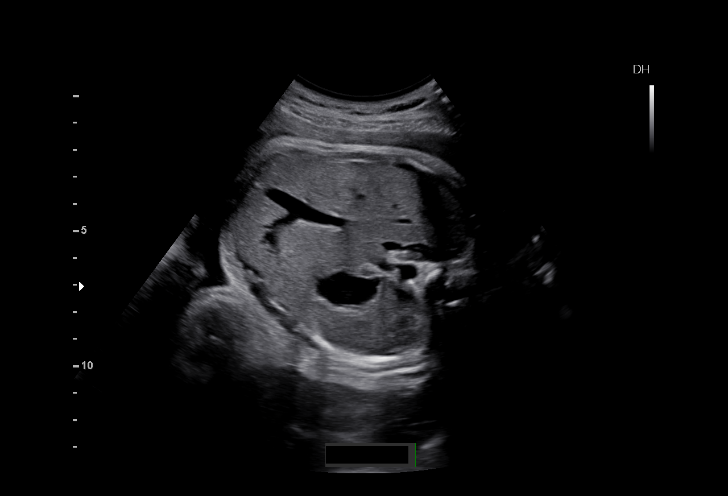
[im 25/33]
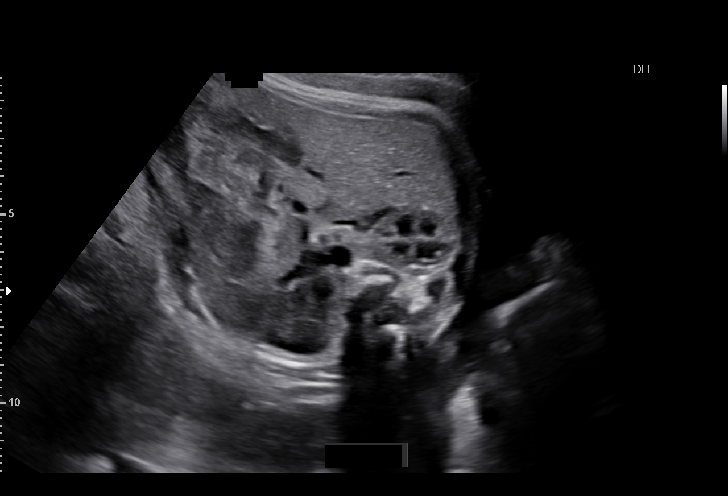
[im 28/33]
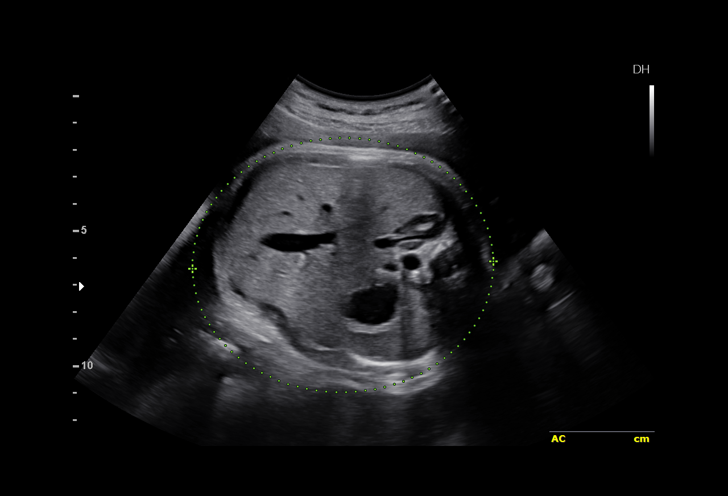
[im 30/33]
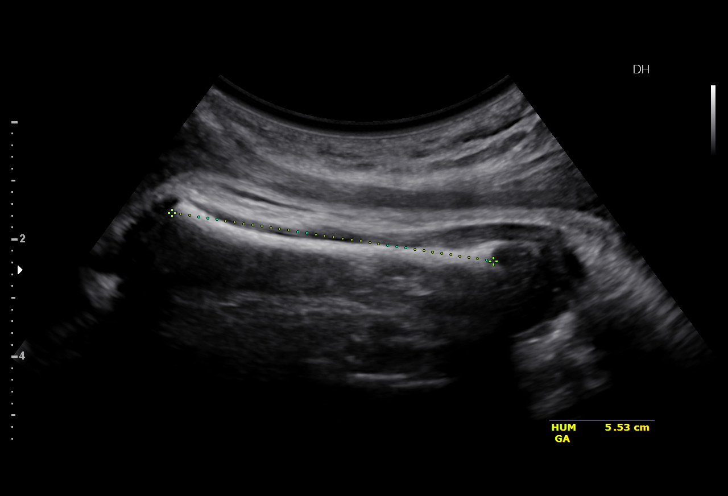
[im 33/33]
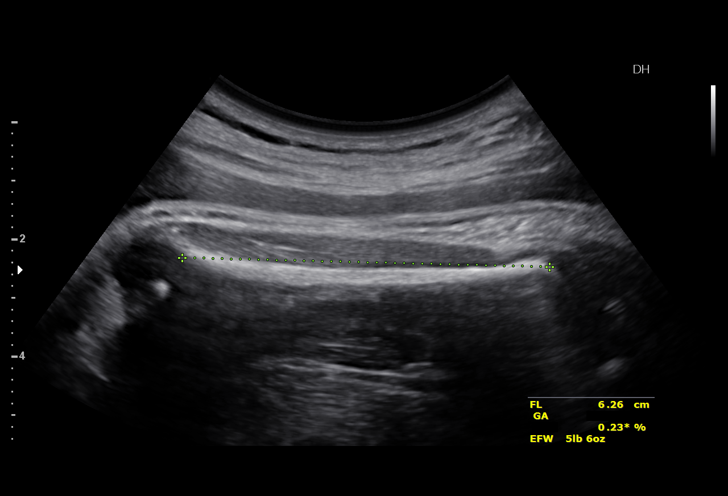

[14 of 28 positions shown; findings below may reference images not displayed]

[REDACTED]-
Faculty Physician
DAE [place] NP

1  NYA JUMPER            [id number]      [id number]     [id number]
Indications

36 weeks gestation of pregnancy
Abnormal biochemical screen; increased
DSR and inhibin; declined all further testing
Teen pregnancy
Late to prenatal care, third trimester
High risk pregnancy with high inhibin          O23.9,
OB History

Gravidity:    1         Term:   0        Prem:   0         SAB:   0
TOP:          0       Ectopic:  0        Living: 0
Fetal Evaluation

Num Of Fetuses:     1
Fetal Heart         164
Rate(bpm):
Cardiac Activity:   Observed
Presentation:       Cephalic
Placenta:           Posterior Fundal, above cervical os
P. Cord Insertion:  Previously Visualized

Amniotic Fluid
AFI FV:      Subjectively within normal limits
AFI Sum:     18.82    cm      71  %Tile      Larg Pckt:   6.26  cm
RUQ:   3.34    cm   RLQ:    4.92   cm    LUQ:   6.26    cm    LLQ:   4.3     cm
Biometry

BPD:      84.3  mm     G. Age:  33w 6d                  CI:         76.93  %    70 - 86
FL/HC:       20.5  %    20.1 -
HC:      304.4  mm     G. Age:  33w 6d        < 3  %    HC/AC:       0.95       0.93 -
AC:      321.6  mm     G. Age:  36w 0d         55  %    FL/BPD:      73.9  %    71 - 87
FL:       62.3  mm     G. Age:  32w 2d        < 3  %    FL/AC:       19.4  %    20 - 24
HUM:      54.9  mm     G. Age:  32w 0d        < 5  %

Est. FW:    2555   gm     5 lb 7 oz     24  %
Gestational Age

LMP:           38w 1d        Date:  10/19/14                 EDD:    07/26/15
U/S Today:     34w 0d                                        EDD:    08/24/15
Best:          36w 2d     Det. By:  U/S  (03/21/15)          EDD:    08/08/15
Cervix Uterus Adnexa

Cervix
Not visualized (advanced GA >39wks)

Left Ovary
Within normal limits.

Right Ovary
Within normal limits.

Adnexa:       No abnormality visualized.
Impression

SIUP at 36+2 weeks
Normal interval anatomy; anatomic survey complete
Normal amniotic fluid volume
Appropriate interval growth with EFW at the 24th %tile
Recommendations

Follow-up as clinically indicated

## 2018-06-19 ENCOUNTER — Emergency Department (HOSPITAL_COMMUNITY)
Admission: EM | Admit: 2018-06-19 | Discharge: 2018-06-19 | Disposition: A | Discharge status: Home / Self Care | Payer: Self-pay | Attending: Emergency Medicine | Admitting: Emergency Medicine

## 2018-06-19 ENCOUNTER — Emergency Department (HOSPITAL_COMMUNITY): Payer: Self-pay

## 2018-06-19 ENCOUNTER — Encounter (HOSPITAL_COMMUNITY): Payer: Self-pay

## 2018-06-19 ENCOUNTER — Other Ambulatory Visit: Payer: Self-pay

## 2018-06-19 DIAGNOSIS — Y999 Unspecified external cause status: Secondary | ICD-10-CM | POA: Insufficient documentation

## 2018-06-19 DIAGNOSIS — S8391XA Sprain of unspecified site of right knee, initial encounter: Secondary | ICD-10-CM | POA: Insufficient documentation

## 2018-06-19 DIAGNOSIS — W109XXA Fall (on) (from) unspecified stairs and steps, initial encounter: Secondary | ICD-10-CM | POA: Insufficient documentation

## 2018-06-19 DIAGNOSIS — Z79899 Other long term (current) drug therapy: Secondary | ICD-10-CM | POA: Insufficient documentation

## 2018-06-19 DIAGNOSIS — Y9389 Activity, other specified: Secondary | ICD-10-CM | POA: Insufficient documentation

## 2018-06-19 DIAGNOSIS — S86911A Strain of unspecified muscle(s) and tendon(s) at lower leg level, right leg, initial encounter: Secondary | ICD-10-CM

## 2018-06-19 DIAGNOSIS — Y9289 Other specified places as the place of occurrence of the external cause: Secondary | ICD-10-CM | POA: Insufficient documentation

## 2018-06-19 DIAGNOSIS — M25561 Pain in right knee: Secondary | ICD-10-CM

## 2018-06-19 DIAGNOSIS — J45909 Unspecified asthma, uncomplicated: Secondary | ICD-10-CM | POA: Insufficient documentation

## 2018-06-19 MED ORDER — IBUPROFEN 800 MG PO TABS: 800.0000 mg | ORAL_TABLET | Freq: Three times a day (TID) | ORAL | 0 refills | Status: DC | PRN

## 2018-06-19 NOTE — ED Notes (Signed)
Pt. To x-ray .
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
## 2018-06-19 NOTE — Discharge Instructions (Addendum)
Return here as needed.  Ice and elevate the knee.  Aloe up as needed with the orthopedist provided.

## 2018-06-19 NOTE — ED Notes (Signed)
Pt. Back from xray
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
## 2018-10-23 ENCOUNTER — Emergency Department (HOSPITAL_COMMUNITY)
Admission: EM | Admit: 2018-10-23 | Discharge: 2018-10-24 | Disposition: A | Discharge status: Home / Self Care | Payer: Self-pay | Attending: Emergency Medicine | Admitting: Emergency Medicine

## 2018-10-23 ENCOUNTER — Encounter (HOSPITAL_COMMUNITY): Payer: Self-pay | Admitting: Emergency Medicine

## 2018-10-23 ENCOUNTER — Other Ambulatory Visit: Payer: Self-pay

## 2018-10-23 DIAGNOSIS — J40 Bronchitis, not specified as acute or chronic: Secondary | ICD-10-CM | POA: Insufficient documentation

## 2018-10-23 DIAGNOSIS — R05 Cough: Secondary | ICD-10-CM | POA: Insufficient documentation

## 2018-10-23 NOTE — ED Triage Notes (Signed)
Pt reports SHOB, subjective fever but has been unable to check it, cough since Tuesday. Pt reports she was around a friend with similar symptoms on Friday, unsure if possible COVID. Pt last took 500 mg of Tylenol at 8:15 tonight. Pt denies recent travel.

## 2018-10-24 ENCOUNTER — Emergency Department (HOSPITAL_COMMUNITY): Payer: Self-pay

## 2018-10-24 MED ORDER — ALBUTEROL SULFATE HFA 108 (90 BASE) MCG/ACT IN AERS
2.0000 | INHALATION_SPRAY | RESPIRATORY_TRACT | Status: DC | PRN
  Administered 2018-10-24: 2 via RESPIRATORY_TRACT
  Filled 2018-10-24: qty 6.7

## 2018-10-24 MED ORDER — PREDNISONE 20 MG PO TABS: 40.0000 mg | ORAL_TABLET | Freq: Every day | ORAL | 0 refills | Status: DC

## 2018-10-24 MED ORDER — PREDNISONE 20 MG PO TABS
60.0000 mg | ORAL_TABLET | Freq: Once | ORAL | Status: AC
  Administered 2018-10-24: 01:00:00 60 mg via ORAL
  Filled 2018-10-24: qty 3

## 2018-10-24 MED ORDER — BENZONATATE 100 MG PO CAPS: 100.0000 mg | ORAL_CAPSULE | Freq: Three times a day (TID) | ORAL | 0 refills | Status: DC | PRN

## 2018-10-24 NOTE — ED Provider Notes (Signed)
[facility] EMERGENCY DEPARTMENT Provider Note   CSN: [id number] Arrival date & time: 10/23/18  2345    History   Chief Complaint Chief Complaint  Patient presents with  . Shortness of Breath    HPI Michelle Calhoun is a 23 y.o. female.     Patient presents to the emergency department with a 2-day history of cough, chest congestion, shortness of breath.  She does report that she has a friend that she was around recently that had similar symptoms.  Unknown if she has any coronavirus exposure.  She has been feeling hot and cold but has not taken a temperature.  Last took Tylenol 4 hours ago.  She reports that her symptoms seem to get worse at night.  She does report a history of asthma as a child but currently does not have any albuterol or other need for treatment.     Past Medical History:  Diagnosis Date  . Asthma   . Hx of chlamydia infection   . Medical history non-contributory   . Vaginal Pap smear, abnormal     Patient Active Problem List   Diagnosis Date Noted  . Post term pregnancy over 40 weeks 08/14/2015  . Abnormal maternal serum screening test 03/28/2015  . Chlamydia infection affecting pregnancy 03/28/2015    Past Surgical History:  Procedure Laterality Date  . NO PAST SURGERIES       OB History    Gravida  1   Para  1   Term  1   Preterm      AB      Living  1     SAB      TAB      Ectopic      Multiple  0   Live Births  1            Home Medications    Prior to Admission medications   Medication Sig Start Date End Date Taking? Authorizing Provider  amLODipine (NORVASC) 5 MG tablet Take 1 tablet (5 mg total) by mouth daily. 08/18/15   Lazaro Arms, MD  benzonatate (TESSALON) 100 MG capsule Take 1 capsule (100 mg total) by mouth 3 (three) times daily as needed for cough. 10/24/18   Gilda Crease, MD  diphenhydrAMINE (BENADRYL) 25 MG tablet Take 25 mg by mouth every 6 (six) hours as needed for  allergies or sleep.    [provider]  docusate sodium (COLACE) 100 MG capsule Take 1 capsule (100 mg total) by mouth 2 (two) times daily. Patient not taking: Reported on 08/14/2015 05/20/15   Melancon, Hillery Hunter, MD  ibuprofen (ADVIL,MOTRIN) 800 MG tablet Take 1 tablet (800 mg total) by mouth every 8 (eight) hours as needed. 06/19/18   Lawyer, Cristal Deer, PA-C  predniSONE (DELTASONE) 20 MG tablet Take 2 tablets (40 mg total) by mouth daily with breakfast. 10/24/18   , Canary Brim, MD  Prenatal Vit-Fe Fumarate-FA (PRENATAL MULTIVITAMIN) TABS tablet Take 1 tablet by mouth daily at 12 noon.    [provider]  triamterene-hydrochlorothiazide (DYAZIDE) 37.5-25 MG capsule Take 1 each (1 capsule total) by mouth daily. 08/18/15   Lazaro Arms, MD    Family History Family History  Problem Relation Age of Onset  . Asthma Brother   . Hypertension Maternal Grandmother   . Alcohol abuse Neg Hx   . Arthritis Neg Hx   . Birth defects Neg Hx   . Cancer Neg Hx   . COPD  Neg Hx   . Depression Neg Hx   . Diabetes Neg Hx   . Drug abuse Neg Hx   . Early death Neg Hx   . Hearing loss Neg Hx   . Hyperlipidemia Neg Hx   . Heart disease Neg Hx   . Kidney disease Neg Hx   . Learning disabilities Neg Hx   . Mental illness Neg Hx   . Mental retardation Neg Hx   . Miscarriages / Stillbirths Neg Hx   . Stroke Neg Hx   . Vision loss Neg Hx   . Varicose Veins Neg Hx     Social History Social History   Tobacco Use  . Smoking status: Never Smoker  . Smokeless tobacco: Never Used  Substance Use Topics  . Alcohol use: No    Comment: denies 08/28/14  . Drug use: No     Allergies   Patient has no known allergies.   Review of Systems Review of Systems  Respiratory: Positive for cough and shortness of breath.   All other systems reviewed and are negative.    Physical Exam Updated Vital Signs BP (!) 133/91 (BP Location: Right Arm)   Pulse 95   Temp 98.1 F (36.7 C)  (Oral)   Resp (!) 21   SpO2 100%   Physical Exam Vitals signs and nursing note reviewed.  Constitutional:      General: She is not in acute distress.    Appearance: Normal appearance. She is well-developed.  HENT:     Head: Normocephalic and atraumatic.     Right Ear: Hearing normal.     Left Ear: Hearing normal.     Nose: Nose normal.  Eyes:     Conjunctiva/sclera: Conjunctivae normal.     Pupils: Pupils are equal, round, and reactive to light.  Neck:     Musculoskeletal: Normal range of motion and neck supple.  Cardiovascular:     Rate and Rhythm: Regular rhythm.     Heart sounds: S1 normal and S2 normal. No murmur. No friction rub. No gallop.   Pulmonary:     Effort: Pulmonary effort is normal. No respiratory distress.     Breath sounds: Normal breath sounds.  Chest:     Chest wall: No tenderness.  Abdominal:     General: Bowel sounds are normal.     Palpations: Abdomen is soft.     Tenderness: There is no abdominal tenderness. There is no guarding or rebound. Negative signs include Murphy's sign and McBurney's sign.     Hernia: No hernia is present.  Musculoskeletal: Normal range of motion.  Skin:    General: Skin is warm and dry.     Findings: No rash.  Neurological:     Mental Status: She is alert and oriented to person, place, and time.     GCS: GCS eye subscore is 4. GCS verbal subscore is 5. GCS motor subscore is 6.     Cranial Nerves: No cranial nerve deficit.     Sensory: No sensory deficit.     Coordination: Coordination normal.  Psychiatric:        Speech: Speech normal.        Behavior: Behavior normal.        Thought Content: Thought content normal.      ED Treatments / Results  Labs (all labs ordered are listed, but only abnormal results are displayed) Labs Reviewed - No data to display  EKG EKG Interpretation  Date/Time:  Thursday October 23 2018 23:57:36 EDT Ventricular Rate:  93 PR Interval:    QRS Duration: 91 QT Interval:  350 QTC  Calculation: 436 R Axis:   -5 Text Interpretation:  Sinus rhythm Borderline short PR interval Low voltage, precordial leads Otherwise within normal limits No significant change since last tracing Confirmed by Gilda CreasePollina,  J [phone]([id number]) on 10/24/2018 12:10:45 AM   Radiology No results found.  Procedures Procedures (including critical care time)  Medications Ordered in ED Medications  predniSONE (DELTASONE) tablet 60 mg (has no administration in time range)  albuterol (VENTOLIN HFA) 108 (90 Base) MCG/ACT inhaler 2 puff (has no administration in time range)     Initial Impression / Assessment and Plan / ED Course  I have reviewed the triage vital signs and the nursing notes.  Pertinent labs & imaging results that were available during my care of the patient were reviewed by me and considered in my medical decision making (see chart for details).        Patient appears well.  Room air oxygen saturation is 99 to 100%.  She is moving air well and has normal lung sounds, no clinical concern for pneumonia.  Additionally x-ray is clear, no evidence of pneumonia or findings of coronavirus.  As she appears well, she is appropriate for outpatient management.  I did discuss with her that we cannot rule out coronavirus, cannot test her currently.  She will need to self quarantine.  No evidence of bacterial infection, will treat for bronchospasm.  Antonietta Barcelonaaneja L Robidoux was evaluated in Emergency Department on 10/24/2018 for the symptoms described in the history of present illness. She was evaluated in the context of the global COVID-19 pandemic, which necessitated consideration that the patient might be at risk for infection with the SARS-CoV-2 virus that causes COVID-19. Institutional protocols and algorithms that pertain to the evaluation of patients at risk for COVID-19 are in a state of rapid change based on information released by regulatory bodies including the CDC and federal and state  organizations. These policies and algorithms were followed during the patient's care in the ED.   Final Clinical Impressions(s) / ED Diagnoses   Final diagnoses:  Bronchitis    ED Discharge Orders         Ordered    predniSONE (DELTASONE) 20 MG tablet  Daily with breakfast     10/24/18 0034    benzonatate (TESSALON) 100 MG capsule  3 times daily PRN     10/24/18 0034           Gilda CreasePollina,  J, MD 10/24/18 [phone]

## 2019-04-13 ENCOUNTER — Other Ambulatory Visit: Payer: Self-pay

## 2019-04-13 ENCOUNTER — Ambulatory Visit (HOSPITAL_COMMUNITY)
Admission: EM | Admit: 2019-04-13 | Discharge: 2019-04-13 | Disposition: A | Discharge status: Home / Self Care | Payer: HRSA Program | Attending: Family Medicine | Admitting: Family Medicine

## 2019-04-13 ENCOUNTER — Encounter (HOSPITAL_COMMUNITY): Payer: Self-pay | Admitting: Emergency Medicine

## 2019-04-13 DIAGNOSIS — Z79899 Other long term (current) drug therapy: Secondary | ICD-10-CM | POA: Insufficient documentation

## 2019-04-13 DIAGNOSIS — J029 Acute pharyngitis, unspecified: Secondary | ICD-10-CM | POA: Diagnosis not present

## 2019-04-13 DIAGNOSIS — J45909 Unspecified asthma, uncomplicated: Secondary | ICD-10-CM | POA: Insufficient documentation

## 2019-04-13 DIAGNOSIS — Z1159 Encounter for screening for other viral diseases: Secondary | ICD-10-CM | POA: Diagnosis not present

## 2019-04-13 DIAGNOSIS — R519 Headache, unspecified: Secondary | ICD-10-CM | POA: Insufficient documentation

## 2019-04-13 DIAGNOSIS — Z20828 Contact with and (suspected) exposure to other viral communicable diseases: Secondary | ICD-10-CM | POA: Insufficient documentation

## 2019-04-13 DIAGNOSIS — R42 Dizziness and giddiness: Secondary | ICD-10-CM | POA: Insufficient documentation

## 2019-04-13 LAB — POCT RAPID STREP A: Streptococcus, Group A Screen (Direct): NEGATIVE

## 2019-04-13 NOTE — Discharge Instructions (Signed)
Self isolate until covid results are back and negative.  Will notify you of any positive findings. You may monitor your results on your MyChart online as well.    Over the counter  treatments as needed for symptoms.  Throat lozenges, gargles, chloraseptic spray, warm teas, popsicles etc to help with throat pain.   If symptoms worsen or do not improve in the next week to return to be seen or to follow up with your PCP.

## 2019-04-13 NOTE — ED Provider Notes (Signed)
[facility]    CSN: [id number] Arrival date & time: 04/13/19  1535      History   Chief Complaint Chief Complaint  Patient presents with  . Sore Throat  . Fever    HPI Michelle Calhoun is a 23 y.o. female.   Michelle Calhoun presents with complaints of sore throat and headache, fevers, light headed. Decreased taste. Started 1 week ago. Chest tight. No cough or congestion. tmax of 101 last night. Tylenol, last last night. No known ill contacts. Diarrhea and constipation. No vomiting. Decreased fluid intake. No known ill contacts. Works at a [employer] in [employer]. Denies any previous similar. History  Of asthma.    ROS per HPI, negative if not otherwise mentioned.      Past Medical History:  Diagnosis Date  . Asthma   . Hx of chlamydia infection   . Medical history non-contributory   . Vaginal Pap smear, abnormal     Patient Active Problem List   Diagnosis Date Noted  . Post term pregnancy over 40 weeks 08/14/2015  . Abnormal maternal serum screening test 03/28/2015  . Chlamydia infection affecting pregnancy 03/28/2015    Past Surgical History:  Procedure Laterality Date  . NO PAST SURGERIES      OB History    Gravida  1   Para  1   Term  1   Preterm      AB      Living  1     SAB      TAB      Ectopic      Multiple  0   Live Births  1            Home Medications    Prior to Admission medications   Medication Sig Start Date End Date Taking? Authorizing Provider  amLODipine (NORVASC) 5 MG tablet Take 1 tablet (5 mg total) by mouth daily. 08/18/15   Lazaro Arms, MD  benzonatate (TESSALON) 100 MG capsule Take 1 capsule (100 mg total) by mouth 3 (three) times daily as needed for cough. Patient not taking: Reported on 04/13/2019 10/24/18   Gilda Crease, MD  diphenhydrAMINE (BENADRYL) 25 MG tablet Take 25 mg by mouth every 6 (six) hours as needed for allergies or sleep.    [provider]   docusate sodium (COLACE) 100 MG capsule Take 1 capsule (100 mg total) by mouth 2 (two) times daily. Patient not taking: Reported on 08/14/2015 05/20/15   Melancon, Hillery Hunter, MD  ibuprofen (ADVIL,MOTRIN) 800 MG tablet Take 1 tablet (800 mg total) by mouth every 8 (eight) hours as needed. 06/19/18   Lawyer, Cristal Deer, PA-C  predniSONE (DELTASONE) 20 MG tablet Take 2 tablets (40 mg total) by mouth daily with breakfast. Patient not taking: Reported on 04/13/2019 10/24/18   Gilda Crease, MD  Prenatal Vit-Fe Fumarate-FA (PRENATAL MULTIVITAMIN) TABS tablet Take 1 tablet by mouth daily at 12 noon.    [provider]  triamterene-hydrochlorothiazide (DYAZIDE) 37.5-25 MG capsule Take 1 each (1 capsule total) by mouth daily. 08/18/15   Lazaro Arms, MD    Family History Family History  Problem Relation Age of Onset  . Asthma Brother   . Hypertension Maternal Grandmother   . Alcohol abuse Neg Hx   . Arthritis Neg Hx   . Birth defects Neg Hx   . Cancer Neg Hx   . COPD Neg Hx   . Depression Neg Hx   . Diabetes  Neg Hx   . Drug abuse Neg Hx   . Early death Neg Hx   . Hearing loss Neg Hx   . Hyperlipidemia Neg Hx   . Heart disease Neg Hx   . Kidney disease Neg Hx   . Learning disabilities Neg Hx   . Mental illness Neg Hx   . Mental retardation Neg Hx   . Miscarriages / Stillbirths Neg Hx   . Stroke Neg Hx   . Vision loss Neg Hx   . Varicose Veins Neg Hx     Social History Social History   Tobacco Use  . Smoking status: Never Smoker  . Smokeless tobacco: Never Used  Substance Use Topics  . Alcohol use: No    Comment: denies 08/28/14  . Drug use: No     Allergies   Patient has no known allergies.   Review of Systems Review of Systems   Physical Exam Triage Vital Signs ED Triage Vitals  Enc Vitals Group     BP 04/13/19 1638 128/78     Pulse Rate 04/13/19 1638 70     Resp 04/13/19 1638 16     Temp 04/13/19 1638 97.8 F (36.6 C)     Temp Source 04/13/19  1638 Temporal     SpO2 04/13/19 1638 100 %     Weight --      Height --      Head Circumference --      Peak Flow --      Pain Score 04/13/19 1654 6     Pain Loc --      Pain Edu? --      Excl. in [place]? --    No data found.  Updated Vital Signs BP 128/78 (BP Location: Left Arm)   Pulse 70   Temp 97.8 F (36.6 C) (Temporal)   Resp 16   SpO2 100%    Physical Exam Constitutional:      General: She is not in acute distress.    Appearance: She is well-developed.  HENT:     Head: Normocephalic.     Right Ear: Tympanic membrane normal.     Left Ear: Tympanic membrane normal.     Nose: No congestion.     Mouth/Throat:     Mouth: Mucous membranes are moist.     Pharynx: No pharyngeal swelling or posterior oropharyngeal erythema.     Tonsils: No tonsillar exudate. 1+ on the right. 1+ on the left.  Cardiovascular:     Rate and Rhythm: Normal rate and regular rhythm.     Heart sounds: Normal heart sounds.  Pulmonary:     Effort: Pulmonary effort is normal.     Breath sounds: Normal breath sounds.  Skin:    General: Skin is warm and dry.  Neurological:     Mental Status: She is alert and oriented to person, place, and time.      UC Treatments / Results  Labs (all labs ordered are listed, but only abnormal results are displayed) Labs Reviewed  NOVEL CORONAVIRUS, NAA (HOSP ORDER, SEND-OUT TO REF LAB; TAT 18-24 HRS)  CULTURE, GROUP A STREP [facility])  POCT RAPID STREP A    EKG   Radiology No results found.  Procedures Procedures (including critical care time)  Medications Ordered in UC Medications - No data to display  Initial Impression / Assessment and Plan / UC Course  I have reviewed the triage vital signs and the nursing notes.  Pertinent labs & imaging results  that were available during my care of the patient were reviewed by me and considered in my medical decision making (see chart for details).     Non toxic. Benign physical exam.  Negative rapid strep.  covid testing pending. Over the counter treatments recommended. Return precautions provided. Patient verbalized understanding and agreeable to plan.   Final Clinical Impressions(s) / UC Diagnoses   Final diagnoses:  Pharyngitis, unspecified etiology     Discharge Instructions     Self isolate until covid results are back and negative.  Will notify you of any positive findings. You may monitor your results on your MyChart online as well.    Over the counter  treatments as needed for symptoms.  Throat lozenges, gargles, chloraseptic spray, warm teas, popsicles etc to help with throat pain.   If symptoms worsen or do not improve in the next week to return to be seen or to follow up with your PCP.      ED Prescriptions    None     PDMP not reviewed this encounter.   Georgetta HaberBurky, Adelayde Minney B, NP 04/13/19 2129

## 2019-04-13 NOTE — ED Triage Notes (Signed)
Pt here for sore throat and fever x 2 days; pt sts some body aches

## 2019-04-16 LAB — CULTURE, GROUP A STREP (THRC)

## 2019-04-16 LAB — NOVEL CORONAVIRUS, NAA (HOSP ORDER, SEND-OUT TO REF LAB; TAT 18-24 HRS): SARS-CoV-2, NAA: NOT DETECTED

## 2019-05-21 ENCOUNTER — Other Ambulatory Visit: Payer: Self-pay

## 2019-05-21 ENCOUNTER — Ambulatory Visit (HOSPITAL_COMMUNITY)
Admission: EM | Admit: 2019-05-21 | Discharge: 2019-05-21 | Disposition: A | Discharge status: Home / Self Care | Payer: Self-pay | Attending: Internal Medicine | Admitting: Internal Medicine

## 2019-05-21 DIAGNOSIS — Z79899 Other long term (current) drug therapy: Secondary | ICD-10-CM | POA: Insufficient documentation

## 2019-05-21 DIAGNOSIS — J45909 Unspecified asthma, uncomplicated: Secondary | ICD-10-CM | POA: Insufficient documentation

## 2019-05-21 DIAGNOSIS — J069 Acute upper respiratory infection, unspecified: Secondary | ICD-10-CM | POA: Insufficient documentation

## 2019-05-21 DIAGNOSIS — Z20828 Contact with and (suspected) exposure to other viral communicable diseases: Secondary | ICD-10-CM | POA: Insufficient documentation

## 2019-05-21 DIAGNOSIS — Z825 Family history of asthma and other chronic lower respiratory diseases: Secondary | ICD-10-CM | POA: Insufficient documentation

## 2019-05-21 LAB — POC SARS CORONAVIRUS 2 AG: SARS Coronavirus 2 Ag: NEGATIVE

## 2019-05-21 MED ORDER — ACETAMINOPHEN 325 MG PO TABS: 650.0000 mg | ORAL_TABLET | Freq: Four times a day (QID) | ORAL | Status: DC | PRN

## 2019-05-21 MED ORDER — ONDANSETRON 4 MG PO TBDP: 4.0000 mg | ORAL_TABLET | Freq: Three times a day (TID) | ORAL | 0 refills | Status: DC | PRN

## 2019-05-21 NOTE — ED Triage Notes (Signed)
Pt. States last sunday she has developed, runny nose, sinus pressure, diahera, vomiting, lose her taste and smell completed, cant taste water

## 2019-05-21 NOTE — ED Provider Notes (Signed)
[place]    CSN: [id number] Arrival date & time: 05/21/19  4742      History   Chief Complaint No chief complaint on file.   HPI Michelle Calhoun is a 23 y.o. female with history of asthma currently controlled comes to urgent care with complaints of runny nose, diarrhea, vomiting as well as loss of taste and smell.  Symptoms started a few days ago and has been persistent.  Patient denies any shortness of breath or cough.  No sputum production.  No fever or chills.  She denies any sick contacts.   HPI  Past Medical History:  Diagnosis Date  . Asthma   . Hx of chlamydia infection   . Medical history non-contributory   . Vaginal Pap smear, abnormal     Patient Active Problem List   Diagnosis Date Noted  . Post term pregnancy over 40 weeks 08/14/2015  . Abnormal maternal serum screening test 03/28/2015  . Chlamydia infection affecting pregnancy 03/28/2015    Past Surgical History:  Procedure Laterality Date  . NO PAST SURGERIES      OB History    Gravida  1   Para  1   Term  1   Preterm      AB      Living  1     SAB      TAB      Ectopic      Multiple  0   Live Births  1            Home Medications    Prior to Admission medications   Medication Sig Start Date End Date Taking? Authorizing Provider  acetaminophen (TYLENOL) 325 MG tablet Take 2 tablets (650 mg total) by mouth every 6 (six) hours as needed. 05/21/19   Chase Picket, MD  amLODipine (NORVASC) 5 MG tablet Take 1 tablet (5 mg total) by mouth daily. 08/18/15   Florian Buff, MD  diphenhydrAMINE (BENADRYL) 25 MG tablet Take 25 mg by mouth every 6 (six) hours as needed for allergies or sleep.    [provider]  ibuprofen (ADVIL,MOTRIN) 800 MG tablet Take 1 tablet (800 mg total) by mouth every 8 (eight) hours as needed. 06/19/18   Lawyer, Harrell Gave, PA-C  ondansetron (ZOFRAN ODT) 4 MG disintegrating tablet Take 1 tablet (4 mg total) by mouth every 8  (eight) hours as needed for nausea or vomiting. 05/21/19   LampteyMyrene Galas, MD  Prenatal Vit-Fe Fumarate-FA (PRENATAL MULTIVITAMIN) TABS tablet Take 1 tablet by mouth daily at 12 noon.    [provider]  triamterene-hydrochlorothiazide (DYAZIDE) 37.5-25 MG capsule Take 1 each (1 capsule total) by mouth daily. 08/18/15   Florian Buff, MD    Family History Family History  Problem Relation Age of Onset  . Asthma Brother   . Hypertension Maternal Grandmother   . Alcohol abuse Neg Hx   . Arthritis Neg Hx   . Birth defects Neg Hx   . Cancer Neg Hx   . COPD Neg Hx   . Depression Neg Hx   . Diabetes Neg Hx   . Drug abuse Neg Hx   . Early death Neg Hx   . Hearing loss Neg Hx   . Hyperlipidemia Neg Hx   . Heart disease Neg Hx   . Kidney disease Neg Hx   . Learning disabilities Neg Hx   . Mental illness Neg Hx   . Mental retardation Neg Hx   .  Miscarriages / Stillbirths Neg Hx   . Stroke Neg Hx   . Vision loss Neg Hx   . Varicose Veins Neg Hx     Social History Social History   Tobacco Use  . Smoking status: Never Smoker  . Smokeless tobacco: Never Used  Substance Use Topics  . Alcohol use: No    Comment: denies 08/28/14  . Drug use: No     Allergies   Patient has no known allergies.   Review of Systems Review of Systems  Constitutional: Negative.   HENT: Positive for sinus pressure. Negative for sore throat.   Respiratory: Negative for cough, choking and shortness of breath.   Gastrointestinal: Positive for diarrhea, nausea and vomiting. Negative for abdominal pain.  Genitourinary: Negative.   Musculoskeletal: Negative for arthralgias, back pain and myalgias.  Skin: Negative.   Neurological: Negative.  Negative for syncope, weakness and headaches.     Physical Exam Triage Vital Signs ED Triage Vitals  Enc Vitals Group     BP 05/21/19 0932 128/86     Pulse Rate 05/21/19 0932 90     Resp 05/21/19 0932 17     Temp 05/21/19 0932 98.6 F (37 C)      Temp Source 05/21/19 0932 Oral     SpO2 05/21/19 0932 100 %     Weight 05/21/19 0931 130 lb (59 kg)     Height --      Head Circumference --      Peak Flow --      Pain Score 05/21/19 0930 0     Pain Loc --      Pain Edu? --      Excl. in [place]? --    No data found.  Updated Vital Signs BP 128/86 (BP Location: Left Arm)   Pulse 90   Temp 98.6 F (37 C) (Oral)   Resp 17   Wt 59 kg   SpO2 100%   Breastfeeding No Comment: depo  BMI 20.36 kg/m   Visual Acuity Right Eye Distance:   Left Eye Distance:   Bilateral Distance:    Right Eye Near:   Left Eye Near:    Bilateral Near:     Physical Exam Constitutional:      General: She is not in acute distress.    Appearance: She is not ill-appearing or diaphoretic.  Cardiovascular:     Rate and Rhythm: Regular rhythm.     Pulses: Normal pulses.     Heart sounds: Normal heart sounds.  Pulmonary:     Effort: Pulmonary effort is normal.     Breath sounds: Normal breath sounds.  Abdominal:     General: Bowel sounds are normal.     Palpations: Abdomen is soft. There is no mass.     Tenderness: There is no abdominal tenderness. There is no rebound.  Musculoskeletal: Normal range of motion.  Skin:    General: Skin is warm.     Capillary Refill: Capillary refill takes less than 2 seconds.     Findings: No erythema.  Neurological:     Mental Status: She is alert and oriented to person, place, and time.      UC Treatments / Results  Labs (all labs ordered are listed, but only abnormal results are displayed) Labs Reviewed  SARS CORONAVIRUS 2 (TAT 6-24 HRS)  NOVEL CORONAVIRUS, NAA (HOSP ORDER, SEND-OUT TO REF LAB; TAT 18-24 HRS)    EKG   Radiology No results found.  Procedures Procedures (including critical  care time)  Medications Ordered in UC Medications - No data to display  Initial Impression / Assessment and Plan / UC Course  I have reviewed the triage vital signs and the nursing notes.  Pertinent labs &  imaging results that were available during my care of the patient were reviewed by me and considered in my medical decision making (see chart for details).     1.  Acute viral illness with gastroenteritis: COVID-19 testing done Patient is advised to increase oral fluid intake Patient is advised to self isolate until COVID-19 test results are available If patient develops significant shortness of breath she is advised to reach out to the urgent care via video visit to be evaluated If patient's COVID-19 testing comes back positive she will need to quarantine per CDC guidelines. Final Clinical Impressions(s) / UC Diagnoses   Final diagnoses:  Viral URI   Discharge Instructions   None    ED Prescriptions    Medication Sig Dispense Auth. Provider   ondansetron (ZOFRAN ODT) 4 MG disintegrating tablet Take 1 tablet (4 mg total) by mouth every 8 (eight) hours as needed for nausea or vomiting. 20 tablet , Britta Mccreedy O, MD   acetaminophen (TYLENOL) 325 MG tablet Take 2 tablets (650 mg total) by mouth every 6 (six) hours as needed.  , Britta Mccreedy O, MD     PDMP not reviewed this encounter.   Merrilee Jansky,  O, MD 05/23/19 2142

## 2019-05-23 LAB — NOVEL CORONAVIRUS, NAA (HOSP ORDER, SEND-OUT TO REF LAB; TAT 18-24 HRS): SARS-CoV-2, NAA: NOT DETECTED

## 2019-07-01 ENCOUNTER — Other Ambulatory Visit: Payer: Self-pay

## 2019-07-01 DIAGNOSIS — Z20822 Contact with and (suspected) exposure to covid-19: Secondary | ICD-10-CM

## 2019-07-02 LAB — NOVEL CORONAVIRUS, NAA: SARS-CoV-2, NAA: NOT DETECTED

## 2019-09-27 ENCOUNTER — Ambulatory Visit (HOSPITAL_COMMUNITY)
Admission: EM | Admit: 2019-09-27 | Discharge: 2019-09-27 | Disposition: A | Discharge status: Home / Self Care | Payer: Medicaid Other | Attending: Urgent Care | Admitting: Urgent Care

## 2019-09-27 ENCOUNTER — Other Ambulatory Visit: Payer: Self-pay

## 2019-09-27 ENCOUNTER — Encounter (HOSPITAL_COMMUNITY): Payer: Self-pay

## 2019-09-27 DIAGNOSIS — Z638 Other specified problems related to primary support group: Secondary | ICD-10-CM

## 2019-09-27 DIAGNOSIS — R457 State of emotional shock and stress, unspecified: Secondary | ICD-10-CM | POA: Diagnosis not present

## 2019-09-27 DIAGNOSIS — J45909 Unspecified asthma, uncomplicated: Secondary | ICD-10-CM | POA: Diagnosis not present

## 2019-09-27 DIAGNOSIS — Z79899 Other long term (current) drug therapy: Secondary | ICD-10-CM | POA: Diagnosis not present

## 2019-09-27 DIAGNOSIS — R5383 Other fatigue: Secondary | ICD-10-CM

## 2019-09-27 DIAGNOSIS — R030 Elevated blood-pressure reading, without diagnosis of hypertension: Secondary | ICD-10-CM | POA: Insufficient documentation

## 2019-09-27 DIAGNOSIS — Z915 Personal history of self-harm: Secondary | ICD-10-CM | POA: Insufficient documentation

## 2019-09-27 DIAGNOSIS — Z20822 Contact with and (suspected) exposure to covid-19: Secondary | ICD-10-CM | POA: Diagnosis not present

## 2019-09-27 DIAGNOSIS — R519 Headache, unspecified: Secondary | ICD-10-CM

## 2019-09-27 DIAGNOSIS — R45851 Suicidal ideations: Secondary | ICD-10-CM | POA: Diagnosis not present

## 2019-09-27 DIAGNOSIS — F322 Major depressive disorder, single episode, severe without psychotic features: Secondary | ICD-10-CM | POA: Diagnosis not present

## 2019-09-27 DIAGNOSIS — Z7901 Long term (current) use of anticoagulants: Secondary | ICD-10-CM | POA: Insufficient documentation

## 2019-09-27 LAB — SARS CORONAVIRUS 2 (TAT 6-24 HRS): SARS Coronavirus 2: NEGATIVE

## 2019-09-27 MED ORDER — IBUPROFEN 800 MG PO TABS
ORAL_TABLET | ORAL | Status: AC
  Filled 2019-09-27: qty 1

## 2019-09-27 MED ORDER — IBUPROFEN 800 MG PO TABS
800.0000 mg | ORAL_TABLET | Freq: Once | ORAL | Status: AC
  Administered 2019-09-27: 800 mg via ORAL

## 2019-09-27 NOTE — ED Triage Notes (Addendum)
Pt c/o severed HAx3 days. Denies light sensitivity, denies vision changes. Pt c/o fatiguex3 days. Pt has been taking tylenol, w/o relief. Pt is also having suicidal thoughts and depression. Pt denies having a suicidal plan. Pt also feels nauseated.

## 2019-09-27 NOTE — ED Provider Notes (Signed)
[facility]   MRN: [id number] DOB: September 04, 1995  Subjective:   Michelle Calhoun is a 24 y.o. female presenting for acute onset of right-sided headache that is frontal but radiates down her face, persistent fatigue.  Symptoms have been ongoing for the past 4 days.  Unfortunately, patient has also been dealing with severe depression and anxiety.  She states that she is very wary of life and wants to give up, end her life.  She is under a lot of stress due to not having a place to live, having split up with her boyfriend within the past year.  She is also currently going through a custody battle for her 47-year-old daughter.  She feels like she is going to lose in court and subsequently lose her daughter.  She admits that she had a severe episode of suicidal thoughts and actually ended up taking 4 pills of Tylenol as an effort to overdose.  This was 2 or 3 months ago.  Fortunately, her friend found her and was able to intervene.  She did start cutting herself thereafter due to her depression.  She has never sought help for her mental health.  Denies family history of suicide attempts, successful suicide.  Patient has never previously attempted suicide other than as mentioned above.  She does not have access to firearms currently.  She is staying with her friend.  No current facility-administered medications for this encounter.  Current Outpatient Medications:  .  acetaminophen (TYLENOL) 325 MG tablet, Take 2 tablets (650 mg total) by mouth every 6 (six) hours as needed., Disp:  , Rfl:  .  amLODipine (NORVASC) 5 MG tablet, Take 1 tablet (5 mg total) by mouth daily., Disp: 30 tablet, Rfl: 3 .  diphenhydrAMINE (BENADRYL) 25 MG tablet, Take 25 mg by mouth every 6 (six) hours as needed for allergies or sleep., Disp: , Rfl:  .  ibuprofen (ADVIL,MOTRIN) 800 MG tablet, Take 1 tablet (800 mg total) by mouth every 8 (eight) hours as needed., Disp: 21 tablet, Rfl: 0 .  ondansetron (ZOFRAN ODT) 4 MG  disintegrating tablet, Take 1 tablet (4 mg total) by mouth every 8 (eight) hours as needed for nausea or vomiting., Disp: 20 tablet, Rfl: 0 .  Prenatal Vit-Fe Fumarate-FA (PRENATAL MULTIVITAMIN) TABS tablet, Take 1 tablet by mouth daily at 12 noon., Disp: , Rfl:  .  triamterene-hydrochlorothiazide (DYAZIDE) 37.5-25 MG capsule, Take 1 each (1 capsule total) by mouth daily., Disp: 30 capsule, Rfl: 1   No Known Allergies  Past Medical History:  Diagnosis Date  . Asthma   . Hx of chlamydia infection   . Medical history non-contributory   . Vaginal Pap smear, abnormal      Past Surgical History:  Procedure Laterality Date  . NO PAST SURGERIES      Family History  Problem Relation Age of Onset  . Asthma Brother   . Hypertension Maternal Grandmother   . Alcohol abuse Neg Hx   . Arthritis Neg Hx   . Birth defects Neg Hx   . Cancer Neg Hx   . COPD Neg Hx   . Depression Neg Hx   . Diabetes Neg Hx   . Drug abuse Neg Hx   . Early death Neg Hx   . Hearing loss Neg Hx   . Hyperlipidemia Neg Hx   . Heart disease Neg Hx   . Kidney disease Neg Hx   . Learning disabilities Neg Hx   . Mental illness Neg Hx   .  Mental retardation Neg Hx   . Miscarriages / Stillbirths Neg Hx   . Stroke Neg Hx   . Vision loss Neg Hx   . Varicose Veins Neg Hx     Social History   Tobacco Use  . Smoking status: Never Smoker  . Smokeless tobacco: Never Used  Substance Use Topics  . Alcohol use: No    Comment: denies 08/28/14  . Drug use: No    ROS   Objective:   Vitals: BP (!) 158/97   Pulse (!) 103   Temp 98.5 F (36.9 C) (Oral)   Resp 16   Ht 5\' 3"  (1.6 m)   Wt 131 lb (59.4 kg)   SpO2 100%   BMI 23.21 kg/m   Physical Exam Constitutional:      General: She is not in acute distress.    Appearance: Normal appearance. She is well-developed. She is not ill-appearing, toxic-appearing or diaphoretic.  HENT:     Head: Normocephalic and atraumatic.     Nose: Nose normal.      Mouth/Throat:     Mouth: Mucous membranes are moist.  Eyes:     General: No scleral icterus.       Right eye: No discharge.        Left eye: No discharge.     Extraocular Movements: Extraocular movements intact.     Pupils: Pupils are equal, round, and reactive to light.  Cardiovascular:     Rate and Rhythm: Normal rate and regular rhythm.     Pulses: Normal pulses.     Heart sounds: Normal heart sounds. No murmur. No friction rub. No gallop.   Pulmonary:     Effort: Pulmonary effort is normal. No respiratory distress.     Breath sounds: Normal breath sounds. No stridor. No wheezing, rhonchi or rales.  Skin:    General: Skin is warm and dry.     Findings: No rash.  Neurological:     Mental Status: She is alert and oriented to person, place, and time.     Cranial Nerves: No cranial nerve deficit.     Motor: No weakness.     Coordination: Coordination normal.     Gait: Gait normal.     Deep Tendon Reflexes: Reflexes normal.  Psychiatric:        Mood and Affect: Mood is depressed. Mood is not elated. Affect is flat. Affect is not labile, blunt, angry, tearful or inappropriate.        Speech: She is communicative. Speech is not rapid and pressured, delayed, slurred or tangential.        Behavior: Behavior is slowed. Behavior is not agitated. Behavior is cooperative.        Thought Content: Thought content is not delusional. Thought content includes suicidal ideation. Thought content does not include homicidal ideation. Thought content does not include homicidal or suicidal plan.        Judgment: Judgment normal. Judgment is not impulsive or inappropriate.     Comments: Patient had 2 crying spells while talking about her stressors during her visit with me.      Assessment and Plan :   1. Bad headache   2. Fatigue, unspecified type   3. Emotional stress   4. Stress due to family tension   5. Passive suicidal ideations   6. Severe depression (HCC)     Patient is having severe  depressive episode with passive suicidal thoughts and is high risk for suicide attempt.  Discussed this  at length with patient and urgent need for a visit at the [facility] emergency room as they can intervene with her mental health.  She was given ibuprofen for headache in clinic which is unilateral.  No neurologic findings suggesting acute intracranial process.  However she does have elevated blood pressure and may be reasonable to consider head CT as deemed appropriate by provider at the ER.  Patient was driven here by her friend and contracts for safety, emphasized need for them to drive her to the emergency room now.  Note for work provided. COVID 19 testing pending.    Jaynee Eagles, PA-C 09/27/19 1254

## 2019-09-27 NOTE — ED Notes (Signed)
Accessed patients chart to obtain the orders for covid test

## 2019-09-27 NOTE — Discharge Instructions (Signed)
Please make sure that you had to the [facility] emergency room as they have a behavioral health department that can better serve you for your severe depression and passive suicidal thoughts.  Please make sure your friend drives you there now.  I will let you know about your COVID-19 test results through MyChart.

## 2019-09-27 NOTE — ED Notes (Signed)
I notified pt that if she wants to be evaluated for suicidal thoughts to go to the ED.

## 2019-10-28 ENCOUNTER — Encounter (HOSPITAL_COMMUNITY): Payer: Self-pay

## 2019-10-28 ENCOUNTER — Other Ambulatory Visit: Payer: Self-pay

## 2019-10-28 ENCOUNTER — Ambulatory Visit (HOSPITAL_COMMUNITY)
Admission: EM | Admit: 2019-10-28 | Discharge: 2019-10-28 | Disposition: A | Discharge status: Home / Self Care | Payer: Medicaid Other | Attending: Family Medicine | Admitting: Family Medicine

## 2019-10-28 DIAGNOSIS — Z113 Encounter for screening for infections with a predominantly sexual mode of transmission: Secondary | ICD-10-CM | POA: Insufficient documentation

## 2019-10-28 DIAGNOSIS — N898 Other specified noninflammatory disorders of vagina: Secondary | ICD-10-CM | POA: Insufficient documentation

## 2019-10-28 MED ORDER — DOXYCYCLINE HYCLATE 100 MG PO CAPS: 100.0000 mg | ORAL_CAPSULE | Freq: Two times a day (BID) | ORAL | 0 refills | Status: AC

## 2019-10-28 MED ORDER — CEFTRIAXONE SODIUM 500 MG IJ SOLR
INTRAMUSCULAR | Status: AC
  Filled 2019-10-28: qty 500

## 2019-10-28 MED ORDER — LIDOCAINE HCL (PF) 1 % IJ SOLN
INTRAMUSCULAR | Status: AC
  Filled 2019-10-28: qty 2

## 2019-10-28 MED ORDER — CEFTRIAXONE SODIUM 500 MG IJ SOLR
500.0000 mg | Freq: Once | INTRAMUSCULAR | Status: AC
  Administered 2019-10-28: 11:00:00 500 mg via INTRAMUSCULAR

## 2019-10-28 NOTE — Discharge Instructions (Signed)
Treated you for gonorrhea and chlamydia today based on exam. Sending swab for testing and you can check your MyChart for results Make sure you finish the complete course of antibiotics

## 2019-10-28 NOTE — ED Provider Notes (Signed)
[facility]    CSN: [id number] Arrival date & time: 10/28/19  2130      History   Chief Complaint Chief Complaint  Patient presents with  . Abdominal Pain  . Vaginal Discharge    HPI Michelle Calhoun is a 24 y.o. female.   Patient is a 24 year old female that presents today with approximately 2 days of vaginal discharge, mild abdominal cramping.  Symptoms have been constant, waxing waning.  Currently sexual active with 1 partner unprotected and concern for STDs.  Describes the discharge as yellow. Denies any vaginal itching, irritation.  Denies any dysuria, hematuria or urinary frequency. Patient's last menstrual period was 08/16/2019.  ROS per HPI      Past Medical History:  Diagnosis Date  . Asthma   . Hx of chlamydia infection   . Medical history non-contributory   . Vaginal Pap smear, abnormal     Patient Active Problem List   Diagnosis Date Noted  . Post term pregnancy over 40 weeks 08/14/2015  . Abnormal maternal serum screening test 03/28/2015  . Chlamydia infection affecting pregnancy 03/28/2015    Past Surgical History:  Procedure Laterality Date  . NO PAST SURGERIES      OB History    Gravida  1   Para  1   Term  1   Preterm      AB      Living  1     SAB      TAB      Ectopic      Multiple  0   Live Births  1            Home Medications    Prior to Admission medications   Medication Sig Start Date End Date Taking? Authorizing Provider  acetaminophen (TYLENOL) 325 MG tablet Take 2 tablets (650 mg total) by mouth every 6 (six) hours as needed. 05/21/19   Lamptey, Britta Mccreedy, MD  doxycycline (VIBRAMYCIN) 100 MG capsule Take 1 capsule (100 mg total) by mouth 2 (two) times daily for 7 days. 10/28/19 11/04/19  Dahlia Byes A, NP  amLODipine (NORVASC) 5 MG tablet Take 1 tablet (5 mg total) by mouth daily. 08/18/15 09/27/19  Lazaro Arms, MD  diphenhydrAMINE (BENADRYL) 25 MG tablet Take 25 mg by mouth every 6 (six) hours  as needed for allergies or sleep.  09/27/19  [provider]  triamterene-hydrochlorothiazide (DYAZIDE) 37.5-25 MG capsule Take 1 each (1 capsule total) by mouth daily. 08/18/15 09/27/19  Lazaro Arms, MD    Family History Family History  Problem Relation Age of Onset  . Asthma Brother   . Hypertension Maternal Grandmother   . Healthy Mother   . Healthy Father   . Alcohol abuse Neg Hx   . Arthritis Neg Hx   . Birth defects Neg Hx   . Cancer Neg Hx   . COPD Neg Hx   . Depression Neg Hx   . Diabetes Neg Hx   . Drug abuse Neg Hx   . Early death Neg Hx   . Hearing loss Neg Hx   . Hyperlipidemia Neg Hx   . Heart disease Neg Hx   . Kidney disease Neg Hx   . Learning disabilities Neg Hx   . Mental illness Neg Hx   . Mental retardation Neg Hx   . Miscarriages / Stillbirths Neg Hx   . Stroke Neg Hx   . Vision loss Neg Hx   . Varicose Veins Neg  Hx     Social History Social History   Tobacco Use  . Smoking status: Never Smoker  . Smokeless tobacco: Never Used  Substance Use Topics  . Alcohol use: No    Comment: denies 08/28/14  . Drug use: No     Allergies   Patient has no known allergies.   Review of Systems Review of Systems   Physical Exam Triage Vital Signs ED Triage Vitals [10/28/19 1005]  Enc Vitals Group     BP 120/90     Pulse Rate 100     Resp 16     Temp 99.5 F (37.5 C)     Temp Source Oral     SpO2 99 %     Weight      Height      Head Circumference      Peak Flow      Pain Score      Pain Loc      Pain Edu?      Excl. in [place]?    No data found.  Updated Vital Signs BP 120/90 (BP Location: Left Arm)   Pulse 100   Temp 99.5 F (37.5 C) (Oral)   Resp 16   LMP 08/16/2019 Comment: DEPO  SpO2 99%   Visual Acuity Right Eye Distance:   Left Eye Distance:   Bilateral Distance:    Right Eye Near:   Left Eye Near:    Bilateral Near:     Physical Exam Vitals and nursing note reviewed.  Constitutional:      General: She is not  in acute distress.    Appearance: Normal appearance. She is not ill-appearing, toxic-appearing or diaphoretic.  HENT:     Head: Normocephalic.     Nose: Nose normal.  Eyes:     Conjunctiva/sclera: Conjunctivae normal.  Pulmonary:     Effort: Pulmonary effort is normal.  Abdominal:     Palpations: Abdomen is soft.     Tenderness: There is no abdominal tenderness.  Genitourinary:    Comments: External vaginal exam normal without lesions, swelling, discharge.  Internal exam with yellow/white thick discharge in vaginal vault and coming from cervix.  There is some cervix friability. No cervical motion tenderness No pelvic or abdominal pain upon palpation Musculoskeletal:        General: Normal range of motion.     Cervical back: Normal range of motion.  Skin:    General: Skin is warm and dry.     Findings: No rash.  Neurological:     Mental Status: She is alert.  Psychiatric:        Mood and Affect: Mood normal.      UC Treatments / Results  Labs (all labs ordered are listed, but only abnormal results are displayed) Labs Reviewed  CERVICOVAGINAL ANCILLARY ONLY    EKG   Radiology No results found.  Procedures Procedures (including critical care time)  Medications Ordered in UC Medications  cefTRIAXone (ROCEPHIN) injection 500 mg (500 mg Intramuscular Given 10/28/19 1034)    Initial Impression / Assessment and Plan / UC Course  I have reviewed the triage vital signs and the nursing notes.  Pertinent labs & imaging results that were available during my care of the patient were reviewed by me and considered in my medical decision making (see chart for details).     Vaginal discharge Based on exam we will go ahead and cover for gonorrhea and chlamydia today. No concern for PID today. Treating with Rocephin  and doxy x1 week to cover Swab sent for further testing Final Clinical Impressions(s) / UC Diagnoses   Final diagnoses:  Vaginal discharge     Discharge  Instructions     Treated you for gonorrhea and chlamydia today based on exam. Sending swab for testing and you can check your MyChart for results Make sure you finish the complete course of antibiotics     ED Prescriptions    Medication Sig Dispense Auth. Provider   doxycycline (VIBRAMYCIN) 100 MG capsule Take 1 capsule (100 mg total) by mouth 2 (two) times daily for 7 days. 14 capsule Salena Ortlieb A, NP     PDMP not reviewed this encounter.   Dahlia Byes A, NP 10/28/19 1039

## 2019-10-28 NOTE — ED Triage Notes (Addendum)
Pt is here with vaginal discharge and abdominal pain that started 2 days ago, pt has not taken any meds to relieve discomfort.

## 2019-10-29 LAB — CERVICOVAGINAL ANCILLARY ONLY
Bacterial Vaginitis (gardnerella): POSITIVE — AB
Candida Glabrata: NEGATIVE
Candida Vaginitis: POSITIVE — AB
Chlamydia: POSITIVE — AB
Comment: NEGATIVE
Comment: NEGATIVE
Comment: NEGATIVE
Comment: NEGATIVE
Comment: NEGATIVE
Comment: NORMAL
Neisseria Gonorrhea: NEGATIVE
Trichomonas: NEGATIVE

## 2019-10-30 ENCOUNTER — Telehealth: Payer: Self-pay

## 2019-10-30 MED ORDER — FLUCONAZOLE 200 MG PO TABS: 200.0000 mg | ORAL_TABLET | Freq: Every day | ORAL | 0 refills | Status: AC

## 2019-10-30 MED ORDER — METRONIDAZOLE 500 MG PO TABS: 500.0000 mg | ORAL_TABLET | Freq: Two times a day (BID) | ORAL | 0 refills | Status: DC

## 2020-01-16 ENCOUNTER — Encounter (HOSPITAL_COMMUNITY): Payer: Self-pay | Admitting: *Deleted

## 2020-01-16 ENCOUNTER — Ambulatory Visit (HOSPITAL_COMMUNITY)
Admission: EM | Admit: 2020-01-16 | Discharge: 2020-01-16 | Disposition: A | Discharge status: Home / Self Care | Payer: Medicaid Other | Attending: Physician Assistant | Admitting: Physician Assistant

## 2020-01-16 ENCOUNTER — Other Ambulatory Visit: Payer: Self-pay

## 2020-01-16 DIAGNOSIS — Z3202 Encounter for pregnancy test, result negative: Secondary | ICD-10-CM | POA: Diagnosis not present

## 2020-01-16 DIAGNOSIS — R1084 Generalized abdominal pain: Secondary | ICD-10-CM

## 2020-01-16 LAB — POC URINE PREG, ED: Preg Test, Ur: NEGATIVE

## 2020-01-16 NOTE — ED Notes (Signed)
Pt seen leaving [facility] dept.  Faythe Dingwall, PA inquiring where pt went; states he had talked with pt, but had not given her the negative pregnancy test result yet.  Attempt made to reach pt and listed phone numbers without success.

## 2020-01-16 NOTE — ED Triage Notes (Addendum)
Pt reports having 2 positive home pregnancy tests, but would like to confirm.  States last menstrual period "sometime in Feb, but don't know the date in Feb".  States has had no pregnancy sxs or any c/o's.

## 2020-01-16 NOTE — ED Provider Notes (Signed)
[facility]    CSN: [id number] Arrival date & time: 01/16/20  1405      History   Chief Complaint Chief Complaint  Patient presents with   Possible Pregnancy    HPI BRICE POTTEIGER is a 24 y.o. female.   Patient reports for pregnancy test. She reports 2 at home positive tests this week. Last regular period was February. She reports spotting each month. She has been having belly cramping over the last week with hormonal symptoms. Denis other symptoms. She would like pregnancy test.      Past Medical History:  Diagnosis Date   Asthma    Hx of chlamydia infection    Vaginal Pap smear, abnormal     Patient Active Problem List   Diagnosis Date Noted   Post term pregnancy over 40 weeks 08/14/2015   Abnormal maternal serum screening test 03/28/2015   Chlamydia infection affecting pregnancy 03/28/2015    Past Surgical History:  Procedure Laterality Date   NO PAST SURGERIES      OB History    Gravida  2   Para  1   Term  1   Preterm      AB      Living  1     SAB      TAB      Ectopic      Multiple  0   Live Births  1            Home Medications    Prior to Admission medications   Medication Sig Start Date End Date Taking? Authorizing Provider  acetaminophen (TYLENOL) 325 MG tablet Take 2 tablets (650 mg total) by mouth every 6 (six) hours as needed. 05/21/19   Lamptey, Britta Mccreedy, MD  metroNIDAZOLE (FLAGYL) 500 MG tablet Take 1 tablet (500 mg total) by mouth 2 (two) times daily. 10/30/19   Lamptey, Britta Mccreedy, MD  amLODipine (NORVASC) 5 MG tablet Take 1 tablet (5 mg total) by mouth daily. 08/18/15 09/27/19  Lazaro Arms, MD  diphenhydrAMINE (BENADRYL) 25 MG tablet Take 25 mg by mouth every 6 (six) hours as needed for allergies or sleep.  09/27/19  [provider]  triamterene-hydrochlorothiazide (DYAZIDE) 37.5-25 MG capsule Take 1 each (1 capsule total) by mouth daily. 08/18/15 09/27/19  Lazaro Arms, MD    Family  History Family History  Problem Relation Age of Onset   Asthma Brother    Hypertension Maternal Grandmother    Healthy Mother    Healthy Father    Alcohol abuse Neg Hx    Arthritis Neg Hx    Birth defects Neg Hx    Cancer Neg Hx    COPD Neg Hx    Depression Neg Hx    Diabetes Neg Hx    Drug abuse Neg Hx    Early death Neg Hx    Hearing loss Neg Hx    Hyperlipidemia Neg Hx    Heart disease Neg Hx    Kidney disease Neg Hx    Learning disabilities Neg Hx    Mental illness Neg Hx    Mental retardation Neg Hx    Miscarriages / Stillbirths Neg Hx    Stroke Neg Hx    Vision loss Neg Hx    Varicose Veins Neg Hx     Social History Social History   Tobacco Use   Smoking status: Never Smoker   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   Review of Systems Review of Systems   Physical Exam Triage Vital Signs ED Triage Vitals [01/16/20 1501]  Enc Vitals Group     BP 124/81     Pulse Rate 83     Resp 16     Temp 98.7 F (37.1 C)     Temp Source Oral     SpO2 100 %     Weight      Height      Head Circumference      Peak Flow      Pain Score 0     Pain Loc      Pain Edu?      Excl. in [place]?    No data found.  Updated Vital Signs BP 124/81    Pulse 83    Temp 98.7 F (37.1 C) (Oral)    Resp 16    SpO2 100%   Visual Acuity Right Eye Distance:   Left Eye Distance:   Bilateral Distance:    Right Eye Near:   Left Eye Near:    Bilateral Near:     Physical Exam Vitals and nursing note reviewed.  Constitutional:      General: She is not in acute distress.    Appearance: She is well-developed. She is not ill-appearing.  HENT:     Head: Normocephalic and atraumatic.  Cardiovascular:     Rate and Rhythm: Normal rate and regular rhythm.     Heart sounds: No murmur heard.   Pulmonary:     Effort:  Pulmonary effort is normal. No respiratory distress.     Breath sounds: Normal breath sounds.  Abdominal:     Palpations: Abdomen is soft.     Tenderness: There is abdominal tenderness (general mid adbominal ttp).     Comments: Soft and non-distended  Musculoskeletal:     Cervical back: Neck supple.  Skin:    General: Skin is warm and dry.  Neurological:     Mental Status: She is alert.      UC Treatments / Results  Labs (all labs ordered are listed, but only abnormal results are displayed) Labs Reviewed  POC URINE PREG, ED    EKG   Radiology No results found.  Procedures Procedures (including critical care time)  Medications Ordered in UC Medications - No data to display  Initial Impression / Assessment and Plan / UC Course  I have reviewed the triage vital signs and the nursing notes.  Pertinent labs & imaging results that were available during my care of the patient were reviewed by me and considered in my medical decision making (see chart for details).     #Negative Pregnancy test Patient is a 24 year old presenting for pregnancy test. Patient with negative Test. Patient had left prior to this provider returning to room to discuss findings. Unable to contact patient at this time by nursing staff. Final Clinical Impressions(s) / UC Diagnoses   Final diagnoses:  Negative pregnancy test   Discharge Instructions   None    ED Prescriptions    None     PDMP not reviewed this encounter.   Hermelinda Medicus, PA-C 01/16/20 1643

## 2020-10-10 ENCOUNTER — Other Ambulatory Visit: Payer: Self-pay

## 2020-10-10 ENCOUNTER — Ambulatory Visit (HOSPITAL_COMMUNITY)
Admission: EM | Admit: 2020-10-10 | Discharge: 2020-10-10 | Disposition: A | Discharge status: Home / Self Care | Payer: Medicaid Other | Attending: Emergency Medicine | Admitting: Emergency Medicine

## 2020-10-10 ENCOUNTER — Encounter (HOSPITAL_COMMUNITY): Payer: Self-pay

## 2020-10-10 DIAGNOSIS — Z113 Encounter for screening for infections with a predominantly sexual mode of transmission: Secondary | ICD-10-CM | POA: Insufficient documentation

## 2020-10-10 DIAGNOSIS — N750 Cyst of Bartholin's gland: Secondary | ICD-10-CM | POA: Insufficient documentation

## 2020-10-10 LAB — POCT URINALYSIS DIPSTICK, ED / UC
Bilirubin Urine: NEGATIVE
Glucose, UA: NEGATIVE mg/dL
Ketones, ur: NEGATIVE mg/dL
Nitrite: NEGATIVE
Protein, ur: 30 mg/dL — AB
Specific Gravity, Urine: 1.015 (ref 1.005–1.030)
Urobilinogen, UA: 1 mg/dL (ref 0.0–1.0)
pH: 7 (ref 5.0–8.0)

## 2020-10-10 LAB — POC URINE PREG, ED: Preg Test, Ur: NEGATIVE

## 2020-10-10 LAB — HIV ANTIBODY (ROUTINE TESTING W REFLEX): HIV Screen 4th Generation wRfx: NONREACTIVE

## 2020-10-10 MED ORDER — AMOXICILLIN-POT CLAVULANATE 875-125 MG PO TABS: 1.0000 | ORAL_TABLET | Freq: Two times a day (BID) | ORAL | 0 refills | Status: DC

## 2020-10-10 MED ORDER — LANSINOH PAIN RELIEF SPRAY 4 % EX SOLN: 1.0000 | Freq: Four times a day (QID) | CUTANEOUS | 0 refills | Status: DC | PRN

## 2020-10-10 NOTE — ED Triage Notes (Signed)
Pt in with c/o vaginal rash that has been persistent for 1 week  Pt states the bumps have burst and been bleeding and she is also having some pain when she uses the bathroom  Also c/o subjective fever and cough that has been going on for 1 week

## 2020-10-10 NOTE — Discharge Instructions (Addendum)
Take the Augmentin once pill twice a day until all pills are gone.  You can utilize the lidocaine spray up to 4 times a day as needed for irritation.  You may also use a sitz bath or apply a warm compress up to 4 times a day as needed for comfort.   We will contact you with the results from your lab work and any additional treatment.    Do not have sex while taking undergoing treatment for STI.  Make sure that all of your partners get tested and treated.   Use a condom or other barrier method for all sexual encounters.    Return or go to the Emergency Department if symptoms worsen or do not improve in the next few days.

## 2020-10-10 NOTE — ED Provider Notes (Signed)
[facility]    CSN: [id number] Arrival date & time: 10/10/20  1632      History   Chief Complaint Chief Complaint  Patient presents with  . Rash  . Pain during urination  . Cough  . Fever    HPI Michelle Calhoun is a 25 y.o. female.   Patient here for evaluation of rash or bumps on genital area that have been ongoing for the past several days.  Also reports having a fever for the past 5 days.  Reports one of the bumps did come to ahead and burst.  Reports some bleeding and drainage from site, but does report being on her period.  Reports having severe irritation and burning.  Denies any dysuria, urgency, or frequency.  Denies any specific alleviating or aggravating factors.  Denies any chest pain, shortness of breath, N/V/D, numbness, tingling, weakness, abdominal pain, or headaches.   ROS: As per HPI, all other pertinent ROS negative  The history is provided by the patient.  Rash Associated symptoms: fever   Cough Associated symptoms: fever and rash   Fever Associated symptoms: cough and rash     Past Medical History:  Diagnosis Date  . Asthma   . Hx of chlamydia infection   . Vaginal Pap smear, abnormal     Patient Active Problem List   Diagnosis Date Noted  . Post term pregnancy over 40 weeks 08/14/2015  . Abnormal maternal serum screening test 03/28/2015  . Chlamydia infection affecting pregnancy 03/28/2015    Past Surgical History:  Procedure Laterality Date  . NO PAST SURGERIES      OB History    Gravida  2   Para  1   Term  1   Preterm      AB      Living  1     SAB      IAB      Ectopic      Multiple  0   Live Births  1            Home Medications    Prior to Admission medications   Medication Sig Start Date End Date Taking? Authorizing Provider  amoxicillin-clavulanate (AUGMENTIN) 875-125 MG tablet Take 1 tablet by mouth every 12 (twelve) hours. 10/10/20  Yes Ivette Loyal, NP  Lidocaine (LANSINOH PAIN  RELIEF SPRAY) 4 % SOLN Apply 1 spray topically 4 (four) times daily as needed. 10/10/20  Yes Ivette Loyal, NP  acetaminophen (TYLENOL) 325 MG tablet Take 2 tablets (650 mg total) by mouth every 6 (six) hours as needed. 05/21/19   Lamptey, Britta Mccreedy, MD  metroNIDAZOLE (FLAGYL) 500 MG tablet Take 1 tablet (500 mg total) by mouth 2 (two) times daily. 10/30/19   Lamptey, Britta Mccreedy, MD  amLODipine (NORVASC) 5 MG tablet Take 1 tablet (5 mg total) by mouth daily. 08/18/15 09/27/19  Lazaro Arms, MD  diphenhydrAMINE (BENADRYL) 25 MG tablet Take 25 mg by mouth every 6 (six) hours as needed for allergies or sleep.  09/27/19  [provider]  triamterene-hydrochlorothiazide (DYAZIDE) 37.5-25 MG capsule Take 1 each (1 capsule total) by mouth daily. 08/18/15 09/27/19  Lazaro Arms, MD    Family History Family History  Problem Relation Age of Onset  . Asthma Brother   . Hypertension Maternal Grandmother   . Healthy Mother   . Healthy Father   . Alcohol abuse Neg Hx   . Arthritis Neg Hx   . Birth defects Neg  Hx   . Cancer Neg Hx   . COPD Neg Hx   . Depression Neg Hx   . Diabetes Neg Hx   . Drug abuse Neg Hx   . Early death Neg Hx   . Hearing loss Neg Hx   . Hyperlipidemia Neg Hx   . Heart disease Neg Hx   . Kidney disease Neg Hx   . Learning disabilities Neg Hx   . Mental illness Neg Hx   . Mental retardation Neg Hx   . Miscarriages / Stillbirths Neg Hx   . Stroke Neg Hx   . Vision loss Neg Hx   . Varicose Veins Neg Hx     Social History Social History   Tobacco Use  . Smoking status: Never Smoker  . Smokeless tobacco: Never Used  Vaping Use  . Vaping Use: Never used  Substance Use Topics  . Alcohol use: No  . Drug use: No     Allergies   Patient has no known allergies.   Review of Systems Review of Systems  Constitutional: Positive for fever.  Respiratory: Positive for cough.   Genitourinary: Positive for genital sores.  Skin: Positive for rash.     Physical  Exam Triage Vital Signs ED Triage Vitals  Enc Vitals Group     BP 10/10/20 1726 122/76     Pulse Rate 10/10/20 1725 96     Resp 10/10/20 1725 17     Temp 10/10/20 1725 99.3 F (37.4 C)     Temp src --      SpO2 10/10/20 1725 98 %     Weight --      Height --      Head Circumference --      Peak Flow --      Pain Score 10/10/20 1724 9     Pain Loc --      Pain Edu? --      Excl. in [place]? --    No data found.  Updated Vital Signs BP 122/76   Pulse 96   Temp 99.3 F (37.4 C)   Resp 17   LMP 10/02/2020 (Approximate)   SpO2 98%   Breastfeeding No   Visual Acuity Right Eye Distance:   Left Eye Distance:   Bilateral Distance:    Right Eye Near:   Left Eye Near:    Bilateral Near:     Physical Exam Vitals and nursing note reviewed.  Constitutional:      General: She is not in acute distress.    Appearance: Normal appearance. She is not ill-appearing, toxic-appearing or diaphoretic.  HENT:     Head: Normocephalic and atraumatic.  Eyes:     Conjunctiva/sclera: Conjunctivae normal.  Cardiovascular:     Rate and Rhythm: Normal rate.     Pulses: Normal pulses.  Pulmonary:     Effort: Pulmonary effort is normal.  Abdominal:     General: Abdomen is flat.     Hernia: There is no hernia in the left inguinal area or right inguinal area.  Genitourinary:    Pubic Area: No rash.      Labia:        Left: Tenderness and lesion (bartholin cyst ) present.   Musculoskeletal:        General: Normal range of motion.     Cervical back: Normal range of motion.  Lymphadenopathy:     Lower Body: No right inguinal adenopathy. No left inguinal adenopathy.  Skin:    General:  Skin is warm and dry.  Neurological:     General: No focal deficit present.     Mental Status: She is alert and oriented to person, place, and time.  Psychiatric:        Mood and Affect: Mood normal.      UC Treatments / Results  Labs (all labs ordered are listed, but only abnormal results are  displayed) Labs Reviewed  POCT URINALYSIS DIPSTICK, ED / UC - Abnormal; Notable for the following components:      Result Value   Hgb urine dipstick LARGE (*)    Protein, ur 30 (*)    Leukocytes,Ua TRACE (*)    All other components within normal limits  RPR  HIV ANTIBODY (ROUTINE TESTING W REFLEX)  POC URINE PREG, ED  CERVICOVAGINAL ANCILLARY ONLY    EKG   Radiology No results found.  Procedures Procedures (including critical care time)  Medications Ordered in UC Medications - No data to display  Initial Impression / Assessment and Plan / UC Course  I have reviewed the triage vital signs and the nursing notes.  Pertinent labs & imaging results that were available during my care of the patient were reviewed by me and considered in my medical decision making (see chart for details).     Bartholin cyst Assessment negative for red flags or concerns.  Cyst already draining purulent drainage does not require I&D.  Augmentin twice daily.  Lidocaine spray and sitz bath up to 4 times a day as needed for comfort.  May also use warm compresses. Urinalysis with blood, protein, and trace leukocytes.  As patient is not having symptoms of UTI this is likely related to contaminated specimen versus infection. Urine pregnancy test was negative.  Screening for STDs  Self swab obtained and will treat based on results.  We will also obtain lab work for RPR and HIV.  Discussed safe sex practices including condom use or other barrier methods. Follow-up with primary care as needed.  Final Clinical Impressions(s) / UC Diagnoses   Final diagnoses:  Bartholin cyst  Screening for STDs (sexually transmitted diseases)     Discharge Instructions     Take the Augmentin once pill twice a day until all pills are gone.  You can utilize the lidocaine spray up to 4 times a day as needed for irritation.  You may also use a sitz bath or apply a warm compress up to 4 times a day as needed for comfort.    We will contact you with the results from your lab work and any additional treatment.    Do not have sex while taking undergoing treatment for STI.  Make sure that all of your partners get tested and treated.   Use a condom or other barrier method for all sexual encounters.    Return or go to the Emergency Department if symptoms worsen or do not improve in the next few days.      ED Prescriptions    Medication Sig Dispense Auth. Provider   amoxicillin-clavulanate (AUGMENTIN) 875-125 MG tablet Take 1 tablet by mouth every 12 (twelve) hours. 14 tablet Ivette Loyal, NP   Lidocaine (LANSINOH PAIN RELIEF SPRAY) 4 % SOLN Apply 1 spray topically 4 (four) times daily as needed. 100 mL Ivette Loyal, NP     PDMP not reviewed this encounter.   Ivette Loyal, NP 10/10/20 Rickey Primus

## 2020-10-11 ENCOUNTER — Ambulatory Visit (HOSPITAL_COMMUNITY): Payer: Self-pay

## 2020-10-11 LAB — CERVICOVAGINAL ANCILLARY ONLY
Bacterial Vaginitis (gardnerella): POSITIVE — AB
Candida Glabrata: NEGATIVE
Candida Vaginitis: NEGATIVE
Chlamydia: NEGATIVE
Comment: NEGATIVE
Comment: NEGATIVE
Comment: NEGATIVE
Comment: NEGATIVE
Comment: NEGATIVE
Comment: NORMAL
Neisseria Gonorrhea: NEGATIVE
Trichomonas: NEGATIVE

## 2020-10-11 LAB — SYPHILIS: RPR W/REFLEX TO RPR TITER AND TREPONEMAL ANTIBODIES, TRADITIONAL SCREENING AND DIAGNOSIS ALGORITHM: RPR Ser Ql: NONREACTIVE

## 2020-10-12 ENCOUNTER — Telehealth (HOSPITAL_COMMUNITY): Payer: Self-pay | Admitting: Emergency Medicine

## 2020-10-12 MED ORDER — METRONIDAZOLE 0.75 % VA GEL: 1.0000 | Freq: Every day | VAGINAL | 0 refills | Status: AC

## 2020-11-23 ENCOUNTER — Other Ambulatory Visit: Payer: Self-pay

## 2020-11-23 ENCOUNTER — Encounter (HOSPITAL_COMMUNITY): Payer: Self-pay

## 2020-11-23 ENCOUNTER — Ambulatory Visit (HOSPITAL_COMMUNITY)
Admission: EM | Admit: 2020-11-23 | Discharge: 2020-11-23 | Disposition: A | Discharge status: Home / Self Care | Payer: Self-pay | Attending: Family Medicine | Admitting: Family Medicine

## 2020-11-23 DIAGNOSIS — J069 Acute upper respiratory infection, unspecified: Secondary | ICD-10-CM | POA: Insufficient documentation

## 2020-11-23 DIAGNOSIS — U071 COVID-19: Secondary | ICD-10-CM | POA: Insufficient documentation

## 2020-11-23 DIAGNOSIS — R059 Cough, unspecified: Secondary | ICD-10-CM | POA: Insufficient documentation

## 2020-11-23 MED ORDER — PROMETHAZINE-DM 6.25-15 MG/5ML PO SYRP: 5.0000 mL | ORAL_SOLUTION | Freq: Four times a day (QID) | ORAL | 0 refills | Status: DC | PRN

## 2020-11-23 NOTE — ED Triage Notes (Signed)
Pt reports cough, loss of taste and smell x 2 days.

## 2020-11-23 NOTE — ED Provider Notes (Signed)
[facility]    CSN: [id number] Arrival date & time: 11/23/20  1529      History   Chief Complaint Chief Complaint  Patient presents with  . Cough    HPI Michelle ERKKILA is a 25 y.o. female.   Presenting today with 2-day history of cough, congestion, scratchy throat, loss of taste and smell.  Denies fever, chills, chest pain, shortness of breath, abdominal pain, nausea vomiting or diarrhea.  Not taking anything for symptoms.  States she recently traveled to the [place] prior to getting sick.  No known chronic medical problems.     Past Medical History:  Diagnosis Date  . Asthma   . Hx of chlamydia infection   . Vaginal Pap smear, abnormal     Patient Active Problem List   Diagnosis Date Noted  . Post term pregnancy over 40 weeks 08/14/2015  . Abnormal maternal serum screening test 03/28/2015  . Chlamydia infection affecting pregnancy 03/28/2015    Past Surgical History:  Procedure Laterality Date  . NO PAST SURGERIES      OB History    Gravida  2   Para  1   Term  1   Preterm      AB      Living  1     SAB      IAB      Ectopic      Multiple  0   Live Births  1            Home Medications    Prior to Admission medications   Medication Sig Start Date End Date Taking? Authorizing Provider  promethazine-dextromethorphan (PROMETHAZINE-DM) 6.25-15 MG/5ML syrup Take 5 mLs by mouth 4 (four) times daily as needed for cough. 11/23/20  Yes Particia Nearing, PA-C  acetaminophen (TYLENOL) 325 MG tablet Take 2 tablets (650 mg total) by mouth every 6 (six) hours as needed. 05/21/19   Merrilee Jansky, MD  amoxicillin-clavulanate (AUGMENTIN) 875-125 MG tablet Take 1 tablet by mouth every 12 (twelve) hours. 10/10/20   Ivette Loyal, NP  Lidocaine Lincoln Maxin PAIN RELIEF SPRAY) 4 % SOLN Apply 1 spray topically 4 (four) times daily as needed. 10/10/20   Ivette Loyal, NP  amLODipine (NORVASC) 5 MG tablet Take 1 tablet (5 mg total) by  mouth daily. 08/18/15 09/27/19  Lazaro Arms, MD  diphenhydrAMINE (BENADRYL) 25 MG tablet Take 25 mg by mouth every 6 (six) hours as needed for allergies or sleep.  09/27/19  [provider]  triamterene-hydrochlorothiazide (DYAZIDE) 37.5-25 MG capsule Take 1 each (1 capsule total) by mouth daily. 08/18/15 09/27/19  Lazaro Arms, MD    Family History Family History  Problem Relation Age of Onset  . Asthma Brother   . Hypertension Maternal Grandmother   . Healthy Mother   . Healthy Father   . Alcohol abuse Neg Hx   . Arthritis Neg Hx   . Birth defects Neg Hx   . Cancer Neg Hx   . COPD Neg Hx   . Depression Neg Hx   . Diabetes Neg Hx   . Drug abuse Neg Hx   . Early death Neg Hx   . Hearing loss Neg Hx   . Hyperlipidemia Neg Hx   . Heart disease Neg Hx   . Kidney disease Neg Hx   . Learning disabilities Neg Hx   . Mental illness Neg Hx   . Mental retardation Neg Hx   . Miscarriages /  Stillbirths Neg Hx   . Stroke Neg Hx   . Vision loss Neg Hx   . Varicose Veins Neg Hx     Social History Social History   Tobacco Use  . Smoking status: Never Smoker  . Smokeless tobacco: Never Used  Vaping Use  . Vaping Use: Never used  Substance Use Topics  . Alcohol use: No  . Drug use: No     Allergies   Patient has no known allergies.   Review of Systems Review of Systems Per HPI  Physical Exam Triage Vital Signs ED Triage Vitals  Enc Vitals Group     BP 11/23/20 1613 120/81     Pulse Rate 11/23/20 1613 (!) 102     Resp 11/23/20 1613 18     Temp 11/23/20 1613 98.3 F (36.8 C)     Temp Source 11/23/20 1613 Oral     SpO2 11/23/20 1613 100 %     Weight --      Height --      Head Circumference --      Peak Flow --      Pain Score 11/23/20 1612 0     Pain Loc --      Pain Edu? --      Excl. in GC? --    No data found.  Updated Vital Signs BP 120/81 (BP Location: Left Arm)   Pulse (!) 102 Comment: Pt talking on teh phone  Temp 98.3 F (36.8 C) (Oral)    Resp 18   LMP 11/21/2020 (Exact Date)   SpO2 100%   Visual Acuity Right Eye Distance:   Left Eye Distance:   Bilateral Distance:    Right Eye Near:   Left Eye Near:    Bilateral Near:     Physical Exam Vitals and nursing note reviewed.  Constitutional:      Appearance: Normal appearance. She is not ill-appearing.  HENT:     Head: Atraumatic.     Right Ear: Tympanic membrane normal.     Left Ear: Tympanic membrane normal.     Nose: Rhinorrhea present.     Mouth/Throat:     Mouth: Mucous membranes are moist.     Pharynx: Oropharynx is clear.  Eyes:     Extraocular Movements: Extraocular movements intact.     Conjunctiva/sclera: Conjunctivae normal.  Cardiovascular:     Rate and Rhythm: Normal rate and regular rhythm.     Heart sounds: Normal heart sounds.  Pulmonary:     Effort: Pulmonary effort is normal.     Breath sounds: Normal breath sounds.  Abdominal:     General: Bowel sounds are normal. There is no distension.     Palpations: Abdomen is soft.     Tenderness: There is no abdominal tenderness. There is no guarding.  Musculoskeletal:        General: Normal range of motion.     Cervical back: Normal range of motion and neck supple.  Skin:    General: Skin is warm and dry.  Neurological:     Mental Status: She is alert and oriented to person, place, and time.  Psychiatric:        Mood and Affect: Mood normal.        Thought Content: Thought content normal.        Judgment: Judgment normal.    UC Treatments / Results  Labs (all labs ordered are listed, but only abnormal results are displayed) Labs Reviewed  SARS CORONAVIRUS 2 (  TAT 6-24 HRS)    EKG  Radiology No results found.  Procedures Procedures (including critical care time)  Medications Ordered in UC Medications - No data to display  Initial Impression / Assessment and Plan / UC Course  I have reviewed the triage vital signs and the nursing notes.  Pertinent labs & imaging results that  were available during my care of the patient were reviewed by me and considered in my medical decision making (see chart for details).     Exam and vitals overall reassuring today, suspect viral illness causing symptoms.  COVID PCR pending, isolation reviewed and work note given.  Will give Phenergan DM and discussed over-the-counter symptomatic medications and supportive home care.  Return for acutely worsening symptoms.  Final Clinical Impressions(s) / UC Diagnoses   Final diagnoses:  Viral URI with cough   Discharge Instructions   None    ED Prescriptions    Medication Sig Dispense Auth. Provider   promethazine-dextromethorphan (PROMETHAZINE-DM) 6.25-15 MG/5ML syrup Take 5 mLs by mouth 4 (four) times daily as needed for cough. 100 mL Particia Nearing, [place]     PDMP not reviewed this encounter.   Particia Nearing, [place] 11/23/20 [phone]

## 2020-11-24 LAB — SARS CORONAVIRUS 2 (TAT 6-24 HRS): SARS Coronavirus 2: POSITIVE — AB

## 2020-11-25 ENCOUNTER — Telehealth: Payer: Self-pay

## 2020-11-25 NOTE — Telephone Encounter (Signed)
Spoke with pt  See note

## 2020-11-25 NOTE — Telephone Encounter (Addendum)
Called to discuss with patient about COVID-19 symptoms and the use of one of the available treatments for those with mild to moderate Covid symptoms and at a high risk of hospitalization.  Pt appears to qualify for outpatient treatment due to co-morbid conditions and/or a member of an at-risk group in accordance with the FDA Emergency Use Authorization.    Symptom onset: Cough 11/22/20 Vaccinated: Yes Booster? Yes Immunocompromised? No Qualifiers:  NIH Criteria: Tier 2  Unable to reach pt - Left message and call back number [phone].  Pt. Called back. Declines any further treatment. Esther Hardy

## 2020-12-26 ENCOUNTER — Other Ambulatory Visit: Payer: Self-pay

## 2020-12-26 ENCOUNTER — Ambulatory Visit
Admission: RE | Admit: 2020-12-26 | Discharge: 2020-12-26 | Disposition: A | Discharge status: Home / Self Care | Payer: Medicaid Other | Source: Ambulatory Visit | Attending: Emergency Medicine | Admitting: Emergency Medicine

## 2020-12-26 VITALS — BP 121/77 | HR 89 | Temp 98.2°F | Resp 18

## 2020-12-26 DIAGNOSIS — Z113 Encounter for screening for infections with a predominantly sexual mode of transmission: Secondary | ICD-10-CM

## 2020-12-26 DIAGNOSIS — R21 Rash and other nonspecific skin eruption: Secondary | ICD-10-CM | POA: Insufficient documentation

## 2020-12-26 DIAGNOSIS — N760 Acute vaginitis: Secondary | ICD-10-CM | POA: Insufficient documentation

## 2020-12-26 LAB — POCT URINALYSIS DIP (MANUAL ENTRY)
Bilirubin, UA: NEGATIVE
Glucose, UA: NEGATIVE mg/dL
Ketones, POC UA: NEGATIVE mg/dL
Nitrite, UA: NEGATIVE
Protein Ur, POC: NEGATIVE mg/dL
Spec Grav, UA: 1.02 (ref 1.010–1.025)
Urobilinogen, UA: 0.2 E.U./dL
pH, UA: 6 (ref 5.0–8.0)

## 2020-12-26 LAB — POCT URINE PREGNANCY: Preg Test, Ur: NEGATIVE

## 2020-12-26 MED ORDER — METRONIDAZOLE 500 MG PO TABS: 500.0000 mg | ORAL_TABLET | Freq: Two times a day (BID) | ORAL | 0 refills | Status: AC

## 2020-12-26 MED ORDER — DOXYCYCLINE HYCLATE 100 MG PO CAPS: 100.0000 mg | ORAL_CAPSULE | Freq: Two times a day (BID) | ORAL | 0 refills | Status: AC

## 2020-12-26 MED ORDER — VALACYCLOVIR HCL 1 G PO TABS: 1000.0000 mg | ORAL_TABLET | Freq: Two times a day (BID) | ORAL | 0 refills | Status: AC

## 2020-12-26 NOTE — Discharge Instructions (Addendum)
I suspect that this could be herpes, but we do not have a way to test for it here today.  I am going to treat it empirically with Valtrex.  If the symptoms come back, then take 1 g once a day for 5 days.  I am seeing him with doxycycline which will cover a folliculitis/skin infection but also cover chlamydia.  Also Flagyl for presumed BV.  We will contact you if we need to make any further changes to your medications.  Below is a list of primary care practices who are taking new patients for you to follow-up with.  [facility] internal medicine clinic Ground Floor - [facility], [address] [place], [place], [place] [address] [phone]  [facility] Primary Care at [facility] [address] [place], [place] [address] [phone]  [facility] [address] [place], [place] [address] [phone]  [facility] [phone] [address] [place] [address]  [facility]: [address] [place] [address]  [phone]  [facility]: [address] [place] [address]  [phone]  [place] [facility] : [address] [place] [address] [phone]  [facility] Primary Care: [address] [place] [address] [phone]  [facility]: [address] [place] [address] [phone]  Dr. Oneal Grout [address] [facility] [place] [address]  [phone]  Go to www.goodrx.com  or www.costplusdrugs.com to look up your medications. This will give you a list of where you can find your prescriptions at the most affordable prices. Or ask the pharmacist what the cash price is, or if they have any other discount programs available to help make your medication more affordable. This can be less expensive  than what you would pay with insurance.

## 2020-12-26 NOTE — ED Provider Notes (Signed)
HPI  SUBJECTIVE:  Michelle Calhoun is a 25 y.o. female who presents with 3 painful masses of gradually increasing size on her labia starting 2 days ago.  She also reports dysuria, frequency.  She has been waxing.  She has had similar symptoms before with shaving.  She reports vaginal odor, thin white odorous discharge and vaginal bleeding but is not sure if it is true vaginal bleeding or if it is coming from the boils.  Reports tingling and pain prior to these boils showing up.  No vomiting, fevers, back, abdominal, pelvic pain.  No urgency, cloudy odorous urine, hematuria.  No recent antibiotics.  No new soaps or body washes.  She is in a long-term relationship with a female who is asymptomatic.  She does not have any other partners.  She is not sure about him.  STDs are a concern today.  She tried taking a hot bath with improvement in her symptoms.  Symptoms are worse with walking or wearing clothing.  She has a past medical history of chlamydia, BV, yeast, Bartholin cyst treated in the ED on 4/11 with Augmentin and sitz bath's.  No history of gonorrhea, HIV, HSV, syphilis, trichomonas,  MRSA, diabetes.  LMP: 5/25.  She is irregular.  She is on Depo.  PMD: [facility] health department.   Past Medical History:  Diagnosis Date   Asthma    Hx of chlamydia infection    Vaginal Pap smear, abnormal     Past Surgical History:  Procedure Laterality Date   NO PAST SURGERIES      Family History  Problem Relation Age of Onset   Asthma Brother    Hypertension Maternal Grandmother    Healthy Mother    Healthy Father    Alcohol abuse Neg Hx    Arthritis Neg Hx    Birth defects Neg Hx    Cancer Neg Hx    COPD Neg Hx    Depression Neg Hx    Diabetes Neg Hx    Drug abuse Neg Hx    Early death Neg Hx    Hearing loss Neg Hx    Hyperlipidemia Neg Hx    Heart disease Neg Hx    Kidney disease Neg Hx    Learning disabilities Neg Hx    Mental illness Neg Hx    Mental retardation Neg Hx     Miscarriages / Stillbirths Neg Hx    Stroke Neg Hx    Vision loss Neg Hx    Varicose Veins Neg Hx     Social History   Tobacco Use   Smoking status: Never   Smokeless tobacco: Never  Vaping Use   Vaping Use: Never used  Substance Use Topics   Alcohol use: No   Drug use: No    No current facility-administered medications for this encounter.  Current Outpatient Medications:    doxycycline (VIBRAMYCIN) 100 MG capsule, Take 1 capsule (100 mg total) by mouth 2 (two) times daily for 7 days., Disp: 14 capsule, Rfl: 0   metroNIDAZOLE (FLAGYL) 500 MG tablet, Take 1 tablet (500 mg total) by mouth 2 (two) times daily for 7 days., Disp: 14 tablet, Rfl: 0   valACYclovir (VALTREX) 1000 MG tablet, Take 1 tablet (1,000 mg total) by mouth 2 (two) times daily for 10 days., Disp: 20 tablet, Rfl: 0   acetaminophen (TYLENOL) 325 MG tablet, Take 2 tablets (650 mg total) by mouth every 6 (six) hours as needed., Disp:  , Rfl:  No Known Allergies   ROS  As noted in HPI.   Physical Exam  BP 121/77 (BP Location: Left Arm)   Pulse 89   Temp 98.2 F (36.8 C) (Oral)   Resp 18   LMP 11/23/2020   SpO2 99%   Constitutional: Well developed, well nourished, no acute distress Eyes:  EOMI, conjunctiva normal bilaterally HENT: Normocephalic, atraumatic,mucus membranes moist Respiratory: Normal inspiratory effort Cardiovascular: Normal rate GI: nondistended soft, nontender. No suprapubic tenderness  back: No CVA tenderness GU: Grouped papular yellowish rash suggestive of either folliculitis or HSV infection on the left labia.  Normal vaginal mucosa.  Normal os. Thin oderous   white vaginal discharge. Uterus smooth, NT. No CMT. No adnexal tenderness. No adnexal masses.  Chaperone present during exam skin: No rash, skin intact Musculoskeletal: no deformities Neurologic: Alert & oriented x 3, no focal neuro deficits Psychiatric: Speech and behavior appropriate   ED Course   Medications - No data  to display  Orders Placed This Encounter  Procedures   Pelvic exam    Standing Status:   Standing    Number of Occurrences:   1   Aerobic Culture w Gram Stain (superficial specimen)    Standing Status:   Standing    Number of Occurrences:   1   Urine Culture    Standing Status:   Standing    Number of Occurrences:   1   POCT urine pregnancy    Standing Status:   Standing    Number of Occurrences:   1   POCT urinalysis dipstick    Standing Status:   Standing    Number of Occurrences:   1    Results for orders placed or performed during the hospital encounter of 12/26/20 (from the past 24 hour(s))  POCT urine pregnancy     Status: None   Collection Time: 12/26/20  4:44 PM  Result Value Ref Range   Preg Test, Ur Negative Negative  POCT urinalysis dipstick     Status: Abnormal   Collection Time: 12/26/20  4:44 PM  Result Value Ref Range   Color, UA yellow yellow   Clarity, UA cloudy (A) clear   Glucose, UA negative negative mg/dL   Bilirubin, UA negative negative   Ketones, POC UA negative negative mg/dL   Spec Grav, UA 4.742 5.956 - 1.025   Blood, UA trace-intact (A) negative   pH, UA 6.0 5.0 - 8.0   Protein Ur, POC negative negative mg/dL   Urobilinogen, UA 0.2 0.2 or 1.0 E.U./dL   Nitrite, UA Negative Negative   Leukocytes, UA Small (1+) (A) Negative   No results found.  ED Clinical Impression  1. Rash of genital area   2. Vaginitis and vulvovaginitis   3. Screening for STD (sexually transmitted disease)     ED Assessment/Plan  She has trace hematuria and small leukocytes, cloudy urine.  H&P suggestive of HSV infection.  However this could be a folliculitis.  There are no herpes cultures available here today.  We will place on Valtrex, and on doxycycline which will cover for folliculitis.  Will also cover chlamydia.  Will send home with Flagyl since she has a history of frequent BV and has white discharge.  Patient does not think that she needs viscous lidocaine.   Sitz bath's.  Gonorrhea, chlamydia, wet prep sent.  Will wait for the results for further treatment.  Suspect dysuria and leukocytes are coming from the rash/vaginal discharge, rather than a true UTI.  We will send this off for culture prior to initiating antibiotic treatment.  Provide primary care list or may follow-up with [facility] for further evaluation.  Will place order for assistance in finding a PMD.  ER return precautions given.  Discussed labs, MDM, plan and followup with patient. Pt agrees with plan.   Meds ordered this encounter  Medications   valACYclovir (VALTREX) 1000 MG tablet    Sig: Take 1 tablet (1,000 mg total) by mouth 2 (two) times daily for 10 days.    Dispense:  20 tablet    Refill:  0   metroNIDAZOLE (FLAGYL) 500 MG tablet    Sig: Take 1 tablet (500 mg total) by mouth 2 (two) times daily for 7 days.    Dispense:  14 tablet    Refill:  0   doxycycline (VIBRAMYCIN) 100 MG capsule    Sig: Take 1 capsule (100 mg total) by mouth 2 (two) times daily for 7 days.    Dispense:  14 capsule    Refill:  0    *This clinic note was created using Scientist, clinical (histocompatibility and immunogenetics). Therefore, there may be occasional mistakes despite careful proofreading.  ?     Domenick Gong, MD 12/27/20 1726

## 2020-12-26 NOTE — ED Triage Notes (Addendum)
Pt c/o 3 boils on her outer vaginal area x2 days. Pt c/o blood in urine and urinary frequency x2 days. Pt c/o white vaginal discharge with a slight odor x1wk.

## 2020-12-27 ENCOUNTER — Other Ambulatory Visit: Payer: Self-pay

## 2020-12-27 ENCOUNTER — Ambulatory Visit (HOSPITAL_COMMUNITY)
Admission: EM | Admit: 2020-12-27 | Discharge: 2020-12-27 | Disposition: A | Discharge status: Home / Self Care | Payer: Medicaid Other | Attending: Family Medicine | Admitting: Family Medicine

## 2020-12-27 ENCOUNTER — Encounter (HOSPITAL_COMMUNITY): Payer: Self-pay | Admitting: Family Medicine

## 2020-12-27 DIAGNOSIS — A549 Gonococcal infection, unspecified: Secondary | ICD-10-CM | POA: Insufficient documentation

## 2020-12-27 DIAGNOSIS — Z113 Encounter for screening for infections with a predominantly sexual mode of transmission: Secondary | ICD-10-CM | POA: Insufficient documentation

## 2020-12-27 LAB — CERVICOVAGINAL ANCILLARY ONLY
Bacterial Vaginitis (gardnerella): POSITIVE — AB
Candida Glabrata: NEGATIVE
Candida Vaginitis: NEGATIVE
Chlamydia: NEGATIVE
Comment: NEGATIVE
Comment: NEGATIVE
Comment: NEGATIVE
Comment: NEGATIVE
Comment: NEGATIVE
Comment: NORMAL
Neisseria Gonorrhea: POSITIVE — AB
Trichomonas: NEGATIVE

## 2020-12-27 LAB — HIV ANTIBODY (ROUTINE TESTING W REFLEX): HIV Screen 4th Generation wRfx: NONREACTIVE

## 2020-12-27 MED ORDER — LIDOCAINE HCL (PF) 1 % IJ SOLN
INTRAMUSCULAR | Status: AC
  Filled 2020-12-27: qty 2

## 2020-12-27 MED ORDER — CEFTRIAXONE SODIUM 500 MG IJ SOLR
500.0000 mg | Freq: Once | INTRAMUSCULAR | Status: AC
  Administered 2020-12-27: 500 mg via INTRAMUSCULAR

## 2020-12-27 MED ORDER — CEFTRIAXONE SODIUM 500 MG IJ SOLR
INTRAMUSCULAR | Status: AC
  Filled 2020-12-27: qty 500

## 2020-12-27 NOTE — ED Triage Notes (Signed)
Pt is present today to be tested for HIV and treatment for previous STD.

## 2020-12-27 NOTE — Discharge Instructions (Addendum)
We have sent testing for sHIV and syphilis. We will notify you of any positive results once they are received. If required, we will prescribe any medications you might need.

## 2020-12-28 ENCOUNTER — Ambulatory Visit: Payer: Self-pay

## 2020-12-28 LAB — RPR: RPR Ser Ql: NONREACTIVE

## 2020-12-28 LAB — URINE CULTURE: Culture: NO GROWTH

## 2020-12-28 NOTE — ED Provider Notes (Addendum)
[facility]   [phone] 12/27/20 Arrival Time: 1540  ASSESSMENT & PLAN:  1. Screening for STDs (sexually transmitted diseases)   2. Gonorrhea    Meds ordered this encounter  Medications   cefTRIAXone (ROCEPHIN) injection 500 mg      Discharge Instructions      We have sent testing for sHIV and syphilis. We will notify you of any positive results once they are received. If required, we will prescribe any medications you might need.    Without s/s of PID.  Pending: Labs Reviewed  HIV ANTIBODY (ROUTINE TESTING W REFLEX)  RPR   Will notify of any positive results. Instructed to refrain from sexual activity for at least seven days.  Reviewed expectations re: course of current medical issues. Questions answered. Outlined signs and symptoms indicating need for more acute intervention. Patient verbalized understanding. After Visit Summary given.   SUBJECTIVE:  Michelle Calhoun is a 25 y.o. female who returns for tx of gonorrhea. Afebrile. No pelvic pain. Requests HIV/RPR testing.  OBJECTIVE:  Vitals:   12/27/20 1647  BP: 124/80  Pulse: 93  Resp: 17  Temp: 98.9 F (37.2 C)  SpO2: 100%     General appearance: alert, cooperative, appears stated age and no distress Abdomen: soft GU: deferred Skin: warm and dry Psychological: alert and cooperative; normal mood and affect.   Labs Reviewed  HIV ANTIBODY (ROUTINE TESTING W REFLEX)  RPR    No Known Allergies  Past Medical History:  Diagnosis Date   Asthma    Hx of chlamydia infection    Vaginal Pap smear, abnormal    Family History  Problem Relation Age of Onset   Asthma Brother    Hypertension Maternal Grandmother    Healthy Mother    Healthy Father    Alcohol abuse Neg Hx    Arthritis Neg Hx    Birth defects Neg Hx    Cancer Neg Hx    COPD Neg Hx    Depression Neg Hx    Diabetes Neg Hx    Drug abuse Neg Hx    Early death Neg Hx    Hearing loss Neg Hx    Hyperlipidemia Neg Hx     Heart disease Neg Hx    Kidney disease Neg Hx    Learning disabilities Neg Hx    Mental illness Neg Hx    Mental retardation Neg Hx    Miscarriages / Stillbirths Neg Hx    Stroke Neg Hx    Vision loss Neg Hx    Varicose Veins Neg Hx    Social History   Socioeconomic History   Marital status: Single    Spouse name: Not on file   Number of children: Not on file   Years of education: Not on file   Highest education level: Not on file  Occupational History   Not on file  Tobacco Use   Smoking status: Never   Smokeless tobacco: Never  Vaping Use   Vaping Use: Never used  Substance and Sexual Activity   Alcohol use: No   Drug use: No   Sexual activity: Yes  Other Topics Concern   Not on file  Social History Narrative   Not on file   Social Determinants of Health   Financial Resource Strain: Not on file  Food Insecurity: Not on file  Transportation Needs: Not on file  Physical Activity: Not on file  Stress: Not on file  Social Connections: Not on file  Intimate  Partner Violence: Not on file           Mardella Layman, MD 12/28/20 7989    Mardella Layman, MD 12/28/20 [phone]

## 2020-12-29 LAB — AEROBIC CULTURE W GRAM STAIN (SUPERFICIAL SPECIMEN)
Culture: NO GROWTH
Gram Stain: NONE SEEN

## 2021-04-07 ENCOUNTER — Ambulatory Visit: Payer: Self-pay

## 2021-12-14 ENCOUNTER — Ambulatory Visit: Payer: Self-pay

## 2021-12-14 ENCOUNTER — Encounter: Payer: Self-pay | Admitting: Emergency Medicine

## 2021-12-14 ENCOUNTER — Ambulatory Visit
Admission: EM | Admit: 2021-12-14 | Discharge: 2021-12-14 | Disposition: A | Discharge status: Home / Self Care | Payer: Commercial Managed Care - PPO | Attending: Urgent Care | Admitting: Urgent Care

## 2021-12-14 DIAGNOSIS — R519 Headache, unspecified: Secondary | ICD-10-CM | POA: Insufficient documentation

## 2021-12-14 DIAGNOSIS — R110 Nausea: Secondary | ICD-10-CM | POA: Insufficient documentation

## 2021-12-14 DIAGNOSIS — N3001 Acute cystitis with hematuria: Secondary | ICD-10-CM

## 2021-12-14 LAB — POCT URINALYSIS DIP (MANUAL ENTRY)
Bilirubin, UA: NEGATIVE
Glucose, UA: NEGATIVE mg/dL
Ketones, POC UA: NEGATIVE mg/dL
Nitrite, UA: POSITIVE — AB
Protein Ur, POC: NEGATIVE mg/dL
Spec Grav, UA: 1.015 (ref 1.010–1.025)
Urobilinogen, UA: 0.2 E.U./dL
pH, UA: 7 (ref 5.0–8.0)

## 2021-12-14 LAB — POCT URINE PREGNANCY: Preg Test, Ur: NEGATIVE

## 2021-12-14 MED ORDER — CEPHALEXIN 500 MG PO CAPS: 500.0000 mg | ORAL_CAPSULE | Freq: Two times a day (BID) | ORAL | 0 refills | Status: DC

## 2021-12-14 MED ORDER — ONDANSETRON 8 MG PO TBDP: 8.0000 mg | ORAL_TABLET | Freq: Three times a day (TID) | ORAL | 0 refills | Status: DC | PRN

## 2021-12-14 MED ORDER — NAPROXEN 500 MG PO TABS: 500.0000 mg | ORAL_TABLET | Freq: Two times a day (BID) | ORAL | 0 refills | Status: DC

## 2021-12-14 NOTE — Discharge Instructions (Signed)

## 2021-12-14 NOTE — ED Triage Notes (Signed)
Pt here after LOC this morning at work. The onsight nurse checked her BP and stated it was high, but pt does not recall the exact measurement. Pt reports dizziness and headache x 2 days.

## 2021-12-14 NOTE — ED Provider Notes (Signed)
[facility]   MRN: [id number] DOB: [birth year]  Subjective:   Michelle Calhoun is a 26 y.o. female presenting for a syncopal episode this morning at work. Patient was standing, does warehouse work, was having persistent headache that started yesterday. She leaned against her co-worker as she started to pass out. They laid her down. Reports she had occasional migraine headaches in the remote past. This episode started yesterday, was intermittent but mostly constant, characterizes the headache as dull/sharp/achy/pressure type sensation over frontal and occipital areas of her head. No vision changes, difficulty using her limbs or walking. Has had some nausea. Has also had mid-sternal chest pain with taking a deep breath mostly at night. No photophobia, neck pain, neck stiffness. Has a history of asthma, allergies. No smoking.  No coughing, shortness of breath or wheezing.  Has been using Tylenol, ibuprofen for her headaches with minimal relief. Patient is sexually active, LMP was 05/23-28/2023, has irregular cycles. No birth control. Drinks ~2 bottles of water per day.  Also drinks soda and sweet tea regularly but tries not to do this daily.  No drug use.  She has previously had anemia but was mild except when she was pregnant years ago.  Has not had issues with it since then.  No current facility-administered medications for this encounter.  Current Outpatient Medications:    acetaminophen (TYLENOL) 325 MG tablet, Take 2 tablets (650 mg total) by mouth every 6 (six) hours as needed., Disp:  , Rfl:    No Known Allergies  Past Medical History:  Diagnosis Date   Asthma    Hx of chlamydia infection    Vaginal Pap smear, abnormal     Past Surgical History:  Procedure Laterality Date   NO PAST SURGERIES     Family History  Problem Relation Age of Onset   Asthma Brother    Hypertension Maternal Grandmother    Healthy Mother    Healthy Father    Alcohol abuse Neg Hx     Arthritis Neg Hx    Birth defects Neg Hx    Cancer Neg Hx    COPD Neg Hx    Depression Neg Hx    Diabetes Neg Hx    Drug abuse Neg Hx    Early death Neg Hx    Hearing loss Neg Hx    Hyperlipidemia Neg Hx    Heart disease Neg Hx    Kidney disease Neg Hx    Learning disabilities Neg Hx    Mental illness Neg Hx    Mental retardation Neg Hx    Miscarriages / Stillbirths Neg Hx    Stroke Neg Hx    Vision loss Neg Hx    Varicose Veins Neg Hx     Social History   Tobacco Use   Smoking status: Never   Smokeless tobacco: Never  Vaping Use   Vaping Use: Never used  Substance Use Topics   Alcohol use: No   Drug use: No    ROS  Objective:   Vitals: BP 114/81   Pulse 78   Temp 98.3 F (36.8 C)   Resp 20   SpO2 98%   Physical Exam Constitutional:      General: She is not in acute distress.    Appearance: Normal appearance. She is well-developed and normal weight. She is not ill-appearing, toxic-appearing or diaphoretic.  HENT:     Head: Normocephalic and atraumatic.     Right Ear: Tympanic membrane, ear canal  and external ear normal. No drainage or tenderness. No middle ear effusion. There is no impacted cerumen. Tympanic membrane is not erythematous.     Left Ear: Tympanic membrane, ear canal and external ear normal. No drainage or tenderness.  No middle ear effusion. There is no impacted cerumen. Tympanic membrane is not erythematous.     Nose: Nose normal. No congestion or rhinorrhea.     Mouth/Throat:     Mouth: Mucous membranes are moist. No oral lesions.     Pharynx: No pharyngeal swelling, oropharyngeal exudate, posterior oropharyngeal erythema or uvula swelling.     Tonsils: No tonsillar exudate or tonsillar abscesses.  Eyes:     General: No scleral icterus.       Right eye: No discharge.        Left eye: No discharge.     Extraocular Movements: Extraocular movements intact.     Right eye: Normal extraocular motion.     Left eye: Normal extraocular motion.      Conjunctiva/sclera: Conjunctivae normal.     Pupils: Pupils are equal, round, and reactive to light.  Neck:     Meningeal: Brudzinski's sign and Kernig's sign absent.  Cardiovascular:     Rate and Rhythm: Normal rate and regular rhythm.     Heart sounds: Normal heart sounds. No murmur heard.    No friction rub. No gallop.  Pulmonary:     Effort: Pulmonary effort is normal. No respiratory distress.     Breath sounds: No stridor. No wheezing, rhonchi or rales.  Chest:     Chest wall: No tenderness.  Abdominal:     General: Bowel sounds are normal. There is no distension.     Palpations: Abdomen is soft. There is no mass.     Tenderness: There is no abdominal tenderness. There is no right CVA tenderness, left CVA tenderness, guarding or rebound.  Musculoskeletal:     Cervical back: Normal range of motion and neck supple.  Lymphadenopathy:     Cervical: No cervical adenopathy.  Skin:    General: Skin is warm and dry.  Neurological:     General: No focal deficit present.     Mental Status: She is alert and oriented to person, place, and time.     Cranial Nerves: No cranial nerve deficit, dysarthria or facial asymmetry.     Motor: No weakness or pronator drift.     Coordination: Romberg sign negative. Coordination normal. Finger-Nose-Finger Test and Heel to [facility] Test normal. Rapid alternating movements normal.     Gait: Gait and tandem walk normal.     Deep Tendon Reflexes: Reflexes normal.  Psychiatric:        Mood and Affect: Mood normal.        Behavior: Behavior normal.        Thought Content: Thought content normal.        Judgment: Judgment normal.     Results for orders placed or performed during the hospital encounter of 12/14/21 (from the past 24 hour(s))  POCT urinalysis dipstick     Status: Abnormal   Collection Time: 12/14/21  1:37 PM  Result Value Ref Range   Color, UA yellow yellow   Clarity, UA cloudy (A) clear   Glucose, UA negative negative mg/dL    Bilirubin, UA negative negative   Ketones, POC UA negative negative mg/dL   Spec Grav, UA 2.706 2.376 - 1.025   Blood, UA trace-intact (A) negative   pH, UA 7.0 5.0 - 8.0  Protein Ur, POC negative negative mg/dL   Urobilinogen, UA 0.2 0.2 or 1.0 E.U./dL   Nitrite, UA Positive (A) Negative   Leukocytes, UA Moderate (2+) (A) Negative  POCT urine pregnancy     Status: None   Collection Time: 12/14/21  1:37 PM  Result Value Ref Range   Preg Test, Ur Negative Negative    Assessment and Plan :   PDMP not reviewed this encounter.  1. Acute cystitis with hematuria   2. Nausea without vomiting   3. Bad headache    Patient has an excellent physical exam, normal neurologic, clear cardiopulmonary and abdominal exams.  Vital signs are hemodynamically stable.  Discussed sources of headaches and at this time patient and I agreed to defer an ER visit for head CT scan, labs.  Will treat for an urinary tract infection.  Start Keflex.  Emphasized need to hydrate much better.  Deferred lab work.  Counseled patient on potential for adverse effects with medications prescribed/recommended today, ER and return-to-clinic precautions discussed, patient verbalized understanding.    Wallis Bamberg, PA-C 12/14/21 1408

## 2021-12-17 LAB — URINE CULTURE: Culture: >=100000 — AB

## 2022-02-28 LAB — OB RESULTS CONSOLE GC/CHLAMYDIA
Chlamydia: NEGATIVE
Neisseria Gonorrhea: NEGATIVE

## 2022-03-15 ENCOUNTER — Encounter (HOSPITAL_COMMUNITY): Payer: Self-pay | Admitting: Obstetrics and Gynecology

## 2022-03-15 ENCOUNTER — Inpatient Hospital Stay (HOSPITAL_COMMUNITY)
Admission: AD | Admit: 2022-03-15 | Discharge: 2022-03-16 | Disposition: A | Discharge status: Home / Self Care | Payer: BC Managed Care – PPO | Attending: Obstetrics and Gynecology | Admitting: Obstetrics and Gynecology

## 2022-03-15 DIAGNOSIS — O26891 Other specified pregnancy related conditions, first trimester: Secondary | ICD-10-CM | POA: Insufficient documentation

## 2022-03-15 DIAGNOSIS — R110 Nausea: Secondary | ICD-10-CM | POA: Insufficient documentation

## 2022-03-15 DIAGNOSIS — O23591 Infection of other part of genital tract in pregnancy, first trimester: Secondary | ICD-10-CM | POA: Insufficient documentation

## 2022-03-15 DIAGNOSIS — O219 Vomiting of pregnancy, unspecified: Secondary | ICD-10-CM

## 2022-03-15 DIAGNOSIS — Z3A11 11 weeks gestation of pregnancy: Secondary | ICD-10-CM | POA: Diagnosis not present

## 2022-03-15 DIAGNOSIS — O98811 Other maternal infectious and parasitic diseases complicating pregnancy, first trimester: Secondary | ICD-10-CM | POA: Diagnosis not present

## 2022-03-15 DIAGNOSIS — B3731 Acute candidiasis of vulva and vagina: Secondary | ICD-10-CM | POA: Insufficient documentation

## 2022-03-15 NOTE — MAU Note (Signed)
.  Michelle Calhoun is a 26 y.o. at [redacted]w[redacted]d here in MAU reporting vaginal irritation and itching. Having white, clumpy d/c. Was on antibiotics last wk for uti but unable to keep down much of the antibiotic. States has "bumps down there" for 2 days.   Onset of complaint: 2 days Pain score: 10 Vitals:   03/15/22 2349 03/15/22 2350  BP:  111/69  Pulse:  89  Resp: 17   Temp: 98.5 F (36.9 C)   SpO2:  100%     FHT:175 Lab orders placed from triage:  none

## 2022-03-16 ENCOUNTER — Encounter (HOSPITAL_COMMUNITY): Payer: Self-pay | Admitting: Obstetrics and Gynecology

## 2022-03-16 DIAGNOSIS — B3731 Acute candidiasis of vulva and vagina: Secondary | ICD-10-CM

## 2022-03-16 DIAGNOSIS — Z3A11 11 weeks gestation of pregnancy: Secondary | ICD-10-CM

## 2022-03-16 DIAGNOSIS — O219 Vomiting of pregnancy, unspecified: Secondary | ICD-10-CM

## 2022-03-16 LAB — GC/CHLAMYDIA PROBE AMP (~~LOC~~) NOT AT ARMC
Chlamydia: NEGATIVE
Comment: NEGATIVE
Comment: NORMAL
Neisseria Gonorrhea: NEGATIVE

## 2022-03-16 LAB — WET PREP, GENITAL
Clue Cells Wet Prep HPF POC: NONE SEEN
Sperm: NONE SEEN
Trich, Wet Prep: NONE SEEN
WBC, Wet Prep HPF POC: <10 (ref <10)

## 2022-03-16 LAB — URINALYSIS, ROUTINE W REFLEX MICROSCOPIC
Bacteria, UA: NONE SEEN
Bilirubin Urine: NEGATIVE
Glucose, UA: NEGATIVE mg/dL
Hgb urine dipstick: NEGATIVE
Ketones, ur: NEGATIVE mg/dL
Nitrite: NEGATIVE
Protein, ur: NEGATIVE mg/dL
Specific Gravity, Urine: 1.012 (ref 1.005–1.030)
pH: 6 (ref 5.0–8.0)

## 2022-03-16 MED ORDER — SCOPOLAMINE 1 MG/3DAYS TD PT72: 1.0000 | MEDICATED_PATCH | TRANSDERMAL | 12 refills | Status: DC

## 2022-03-16 MED ORDER — SCOPOLAMINE 1 MG/3DAYS TD PT72
1.0000 | MEDICATED_PATCH | Freq: Once | TRANSDERMAL | Status: DC
  Administered 2022-03-16: 1.5 mg via TRANSDERMAL
  Filled 2022-03-16: qty 1

## 2022-03-16 MED ORDER — TERCONAZOLE 0.4 % VA CREA: 1.0000 | TOPICAL_CREAM | Freq: Every day | VAGINAL | 0 refills | Status: DC

## 2022-03-16 MED ORDER — ONDANSETRON 4 MG PO TBDP
4.0000 mg | ORAL_TABLET | Freq: Once | ORAL | Status: AC
  Administered 2022-03-16: 4 mg via ORAL
  Filled 2022-03-16: qty 1

## 2022-03-16 NOTE — MAU Provider Note (Signed)
Chief Complaint: Vaginal Discharge and Vaginal Itching   Event Date/Time   First Provider Initiated Contact with Patient 03/16/22 0025        SUBJECTIVE HPI: Michelle Calhoun is a 26 y.o. G2P1001 at [redacted]w[redacted]d by LMP who presents to maternity admissions reporting vaginal irritation for 2 days.  States feels like there are painful bumps on vagina   Has not tried anything.  Was prescribed antibiotics but nauseated. . She denies vaginal bleeding, urinary symptoms, h/a, dizziness, n/v, or fever/chills.   Has appointment in office later this morning.   Vaginal Discharge The patient's primary symptoms include genital itching (very irritated, burns) and vaginal discharge. The patient's pertinent negatives include no pelvic pain or vaginal bleeding. This is a new problem. The current episode started in the past 7 days. The problem has been gradually worsening. She is pregnant. Associated symptoms include nausea and vomiting. Pertinent negatives include no abdominal pain, back pain, chills or fever. The vaginal discharge was clear. There has been no bleeding. She has not been passing clots. She has not been passing tissue. The symptoms are aggravated by tactile pressure. She has tried nothing for the symptoms.  Vaginal Itching The patient's primary symptoms include genital itching (very irritated, burns) and vaginal discharge. The patient's pertinent negatives include no pelvic pain or vaginal bleeding. This is a new problem. The current episode started in the past 7 days. The problem occurs constantly. The problem has been gradually worsening. She is pregnant. Associated symptoms include nausea and vomiting. Pertinent negatives include no abdominal pain, back pain, chills or fever.   RN Note: Michelle Calhoun is a 26 y.o. at [redacted]w[redacted]d here in MAU reporting vaginal irritation and itching. Having white, clumpy d/c. Was on antibiotics last wk for uti but unable to keep down much of the antibiotic. States has  "bumps down there" for 2 days.   Onset of complaint: 2 days Pain score: 10  Past Medical History:  Diagnosis Date   Asthma    Hx of chlamydia infection    Vaginal Pap smear, abnormal    Past Surgical History:  Procedure Laterality Date   NO PAST SURGERIES     Social History   Socioeconomic History   Marital status: Married    Spouse name: Not on file   Number of children: Not on file   Years of education: Not on file   Highest education level: Not on file  Occupational History   Not on file  Tobacco Use   Smoking status: Never   Smokeless tobacco: Never  Vaping Use   Vaping Use: Never used  Substance and Sexual Activity   Alcohol use: No   Drug use: No   Sexual activity: Yes  Other Topics Concern   Not on file  Social History Narrative   Not on file   Social Determinants of Health   Financial Resource Strain: Not on file  Food Insecurity: Not on file  Transportation Needs: Not on file  Physical Activity: Not on file  Stress: Not on file  Social Connections: Not on file  Intimate Partner Violence: Not on file   No current facility-administered medications on file prior to encounter.   Current Outpatient Medications on File Prior to Encounter  Medication Sig Dispense Refill   cephALEXin (KEFLEX) 500 MG capsule Take 1 capsule (500 mg total) by mouth 2 (two) times daily. 10 capsule 0   Prenatal Vit-Fe Fumarate-FA (PRENATAL MULTIVITAMIN) TABS tablet Take 1 tablet by mouth daily at 12  noon.     acetaminophen (TYLENOL) 325 MG tablet Take 2 tablets (650 mg total) by mouth every 6 (six) hours as needed.     naproxen (NAPROSYN) 500 MG tablet Take 1 tablet (500 mg total) by mouth 2 (two) times daily with a meal. 30 tablet 0   ondansetron (ZOFRAN-ODT) 8 MG disintegrating tablet Take 1 tablet (8 mg total) by mouth every 8 (eight) hours as needed for nausea or vomiting. 20 tablet 0   [DISCONTINUED] amLODipine (NORVASC) 5 MG tablet Take 1 tablet (5 mg total) by mouth daily.  30 tablet 3   [DISCONTINUED] diphenhydrAMINE (BENADRYL) 25 MG tablet Take 25 mg by mouth every 6 (six) hours as needed for allergies or sleep.     [DISCONTINUED] triamterene-hydrochlorothiazide (DYAZIDE) 37.5-25 MG capsule Take 1 each (1 capsule total) by mouth daily. 30 capsule 1   No Known Allergies  I have reviewed patient's Past Medical Hx, Surgical Hx, Family Hx, Social Hx, medications and allergies.   ROS:  Review of Systems  Constitutional:  Negative for chills and fever.  Gastrointestinal:  Positive for nausea and vomiting. Negative for abdominal pain.  Genitourinary:  Positive for vaginal discharge. Negative for pelvic pain.  Musculoskeletal:  Negative for back pain.   Review of Systems  Other systems negative   Physical Exam  Physical Exam Patient Vitals for the past 24 hrs:  BP Temp Pulse Resp SpO2 Height Weight  03/16/22 0020 112/64 -- 84 -- 100 % -- --  03/15/22 2350 111/69 -- 89 -- 100 % -- --  03/15/22 2349 -- 98.5 F (36.9 C) -- 17 -- 5\' 4"  (1.626 m) 60.3 kg   Constitutional: Well-developed, well-nourished female in no acute distress.  Cardiovascular: normal rate Respiratory: normal effort GI: Abd soft, non-tender. Pos BS x 4 MS: Extremities nontender, no edema, normal ROM Neurologic: Alert and oriented x 4.  GU: Neg CVAT.  PELVIC EXAM: Thick adherent yeast like discharge noted   No obvious lesions visible but vulva are excoriated and erethematous.   FHT 175 by doppler  LAB RESULTS Results for orders placed or performed during the hospital encounter of 03/15/22 (from the past 24 hour(s))  Urinalysis, Routine w reflex microscopic     Status: Abnormal   Collection Time: 03/16/22 12:08 AM  Result Value Ref Range   Color, Urine YELLOW YELLOW   APPearance CLEAR CLEAR   Specific Gravity, Urine 1.012 1.005 - 1.030   pH 6.0 5.0 - 8.0   Glucose, UA NEGATIVE NEGATIVE mg/dL   Hgb urine dipstick NEGATIVE NEGATIVE   Bilirubin Urine NEGATIVE NEGATIVE   Ketones,  ur NEGATIVE NEGATIVE mg/dL   Protein, ur NEGATIVE NEGATIVE mg/dL   Nitrite NEGATIVE NEGATIVE   Leukocytes,Ua TRACE (A) NEGATIVE   RBC / HPF 0-5 0 - 5 RBC/hpf   WBC, UA 0-5 0 - 5 WBC/hpf   Bacteria, UA NONE SEEN NONE SEEN   Squamous Epithelial / LPF 0-5 0 - 5  Wet prep, genital     Status: Abnormal   Collection Time: 03/16/22 12:31 AM  Result Value Ref Range   Yeast Wet Prep HPF POC PRESENT (A) NONE SEEN   Trich, Wet Prep NONE SEEN NONE SEEN   Clue Cells Wet Prep HPF POC NONE SEEN NONE SEEN   WBC, Wet Prep HPF POC <10 <10   Sperm NONE SEEN    GC/Chlamydia sent     IMAGING No results found.  MAU Management/MDM: I have reviewed the triage vital signs and the nursing  notes.   Pertinent labs & imaging results that were available during my care of the patient were reviewed by me and considered in my medical decision making (see chart for details).      I have reviewed her medical records including past results, notes and treatments.   Wet prep positive for yeast Zofran given for nausea so that she can take her antibiotics as ordered Discussed use of Terazol and how to use Discussed should begin to feel relief in 1-2 days  ASSESSMENT Single IUP at [redacted]w[redacted]d Yeast vaginitis Nausea of pregnancy  PLAN Discharge home Rx Terazol 7 for yeast Has antiemetic at home Added Scopolamine patch Keep appt in AM as scheduled Pt stable at time of discharge. Encouraged to return here if she develops worsening of symptoms, increase in pain, fever, or other concerning symptoms.    Wynelle Bourgeois CNM, MSN Certified Nurse-Midwife 03/16/2022  12:25 AM

## 2022-04-10 DIAGNOSIS — Z1388 Encounter for screening for disorder due to exposure to contaminants: Secondary | ICD-10-CM | POA: Diagnosis not present

## 2022-04-11 DIAGNOSIS — Z3482 Encounter for supervision of other normal pregnancy, second trimester: Secondary | ICD-10-CM | POA: Diagnosis not present

## 2022-04-11 DIAGNOSIS — Z3480 Encounter for supervision of other normal pregnancy, unspecified trimester: Secondary | ICD-10-CM | POA: Diagnosis not present

## 2022-04-11 DIAGNOSIS — N390 Urinary tract infection, site not specified: Secondary | ICD-10-CM | POA: Diagnosis not present

## 2022-04-11 LAB — OB RESULTS CONSOLE RUBELLA ANTIBODY, IGM: Rubella: IMMUNE

## 2022-04-11 LAB — OB RESULTS CONSOLE HEPATITIS B SURFACE ANTIGEN: Hepatitis B Surface Ag: NEGATIVE

## 2022-04-11 LAB — OB RESULTS CONSOLE HIV ANTIBODY (ROUTINE TESTING): HIV: NONREACTIVE

## 2022-05-31 DIAGNOSIS — Z363 Encounter for antenatal screening for malformations: Secondary | ICD-10-CM | POA: Diagnosis not present

## 2022-05-31 DIAGNOSIS — Z3A22 22 weeks gestation of pregnancy: Secondary | ICD-10-CM | POA: Diagnosis not present

## 2022-06-16 ENCOUNTER — Inpatient Hospital Stay (HOSPITAL_COMMUNITY)
Admission: AD | Admit: 2022-06-16 | Discharge: 2022-06-16 | Disposition: A | Discharge status: Home / Self Care | Payer: Medicaid Other | Attending: Obstetrics and Gynecology | Admitting: Obstetrics and Gynecology

## 2022-06-16 ENCOUNTER — Encounter (HOSPITAL_COMMUNITY): Payer: Self-pay | Admitting: Obstetrics and Gynecology

## 2022-06-16 DIAGNOSIS — R102 Pelvic and perineal pain: Secondary | ICD-10-CM | POA: Insufficient documentation

## 2022-06-16 DIAGNOSIS — Z3A25 25 weeks gestation of pregnancy: Secondary | ICD-10-CM | POA: Insufficient documentation

## 2022-06-16 DIAGNOSIS — Z3689 Encounter for other specified antenatal screening: Secondary | ICD-10-CM | POA: Diagnosis not present

## 2022-06-16 DIAGNOSIS — B9689 Other specified bacterial agents as the cause of diseases classified elsewhere: Secondary | ICD-10-CM

## 2022-06-16 DIAGNOSIS — Z3A24 24 weeks gestation of pregnancy: Secondary | ICD-10-CM | POA: Diagnosis not present

## 2022-06-16 DIAGNOSIS — O26892 Other specified pregnancy related conditions, second trimester: Secondary | ICD-10-CM | POA: Diagnosis not present

## 2022-06-16 DIAGNOSIS — R1030 Lower abdominal pain, unspecified: Secondary | ICD-10-CM | POA: Diagnosis not present

## 2022-06-16 LAB — URINALYSIS, ROUTINE W REFLEX MICROSCOPIC
Bilirubin Urine: NEGATIVE
Glucose, UA: NEGATIVE mg/dL
Hgb urine dipstick: NEGATIVE
Ketones, ur: 20 mg/dL — AB
Nitrite: NEGATIVE
Protein, ur: NEGATIVE mg/dL
Specific Gravity, Urine: 1.013 (ref 1.005–1.030)
pH: 7 (ref 5.0–8.0)

## 2022-06-16 LAB — WET PREP, GENITAL
Sperm: NONE SEEN
Trich, Wet Prep: NONE SEEN
WBC, Wet Prep HPF POC: >=10 — AB (ref <10)
Yeast Wet Prep HPF POC: NONE SEEN

## 2022-06-16 MED ORDER — ACETAMINOPHEN 500 MG PO TABS
1000.0000 mg | ORAL_TABLET | Freq: Once | ORAL | Status: AC
  Administered 2022-06-16: 1000 mg via ORAL
  Filled 2022-06-16: qty 2

## 2022-06-16 MED ORDER — METRONIDAZOLE 500 MG PO TABS: 500.0000 mg | ORAL_TABLET | Freq: Two times a day (BID) | ORAL | 0 refills | Status: AC

## 2022-06-16 MED ORDER — CYCLOBENZAPRINE HCL 10 MG PO TABS: 10.0000 mg | ORAL_TABLET | Freq: Two times a day (BID) | ORAL | 0 refills | Status: DC | PRN

## 2022-06-16 NOTE — Discharge Instructions (Addendum)
Round Ligament Pain during Pregnancy Many women will experience a type of pain referred to as "round ligament pain" during their pregnancy. This is associated with abdominal pain or discomfort. Since any type of abdominal pain during pregnancy can be disconcerting, it is important to talk about round ligament pain to relieve any anxiety or fears you may have regarding the symptoms you are feeling. Round ligament pain is due to normal changes that take place in the body during pregnancy. It is caused by stretching of the round ligaments attached to the uterus. More commonly it occurs on the right side of the pelvis. Round Ligament: An Overview Typically in the non-pregnant state the uterus is about the size of an apple or pear. There are thick ligaments which hold the uterus in place in the abdomen, referred to as round ligaments. During pregnancy, your uterus will expand in size and weight, and the ligaments supporting it will have to stretch, becoming longer and thinner. As these ligaments pull and tug they may irritate nearby nerve fibers, which causes pain. The severity of the pain in some cases can seem extreme. Some common symptoms of round ligament pain include: . Ligament spasms or contractions/cramps that trigger a sharp pain typically on the right side of the abdomen. . Pain upon waking or suddenly rolling over in your sleep. . Pain in the abdomen that is sharp brought on by exercise or other vigorous activity. Similar Problems Round ligament pain is often mistaken for other medical conditions because the symptoms are similar. Acute abdominal pain during pregnancy may also be a sign of other conditions including: . Abdominal cramps - Some abdominal pain is simply caused by change in bowel habits associated with pregnancy. Gas is a common problem that can cause sharp, shooting pain. You should always seek out medical care if your pain is accompanied by fever, chills, pain  upon urination or if you have difficulty walking. Further exams and tests will be conducted to ensure that you do not have a more serious condition. It is not uncommon for women with lower abdominal pain to have a urinary tract infection, thus you may also be asked for a urine sample. Treatment If all other conditions are ruled out you can treat your round ligament pain relatively easily. You may be advised to take some acetaminophen (Tylenol) to reduce the severity of any persistent pain and asked to reduce your activity level. You can apply a heating pad to the area of pain or take a warm bath. Lying on the opposite side of the pain may help as well. Most women will find relief from round ligament pain simply by altering their daily routines slightly. The good news is round ligament pain will disappear completely once you have given birth to your child!   Safe Medications in Pregnancy   Acne: Benzoyl Peroxide Salicylic Acid  Backache/Headache: Tylenol: 2 regular strength every 4 hours OR              2 Extra strength every 6 hours  Colds/Coughs/Allergies: Benadryl (alcohol free) 25 mg every 6 hours as needed Breath right strips Claritin Cepacol throat lozenges Chloraseptic throat spray Cold-Eeze- up to three times per day Cough drops, alcohol free Flonase (by prescription only) Guaifenesin Mucinex Robitussin DM (plain only, alcohol free) Saline nasal spray/drops Sudafed (pseudoephedrine) & Actifed ** use only after [redacted] weeks gestation and if you do not have high blood pressure Tylenol Vicks Vaporub Zinc lozenges Zyrtec   Constipation: Colace Ducolax suppositories Fleet   enema Glycerin suppositories Metamucil Milk of magnesia Miralax Senokot Smooth move tea  Diarrhea: Kaopectate Imodium A-D  *NO pepto Bismol  Hemorrhoids: Anusol Anusol HC Preparation H Tucks  Indigestion: Tums Maalox Mylanta Zantac  Pepcid  Insomnia: Benadryl (alcohol free)  25mg  every 6 hours as needed Tylenol PM Unisom, no Gelcaps  Leg Cramps: Tums MagGel  Nausea/Vomiting:  Bonine Dramamine Emetrol Ginger extract Sea bands Meclizine  Nausea medication to take during pregnancy:  Unisom (doxylamine succinate 25 mg tablets) Take one tablet daily at bedtime. If symptoms are not adequately controlled, the dose can be increased to a maximum recommended dose of two tablets daily (1/2 tablet in the morning, 1/2 tablet mid-afternoon and one at bedtime). Vitamin B6 100mg  tablets. Take one tablet twice a day (up to 200 mg per day).  Skin Rashes: Aveeno products Benadryl cream or 25mg  every 6 hours as needed Calamine Lotion 1% cortisone cream  Yeast infection: Gyne-lotrimin 7 Monistat 7   **If taking multiple medications, please check labels to avoid duplicating the same active ingredients **take medication as directed on the label ** Do not exceed 4000 mg of tylenol in 24 hours **Do not take medications that contain aspirin or ibuprofen

## 2022-06-16 NOTE — MAU Provider Note (Signed)
History     CSN: [id number]  Arrival date and time: 06/16/22 1543   Event Date/Time   First Provider Initiated Contact with Patient 06/16/22 1729      Chief Complaint  Patient presents with   Abdominal Pain   HPI Michelle Calhoun is a 26 y.o. G2P1001 at [redacted]w[redacted]d who presents to MAU with bilateral groin and lower abdominal pain. These are new complaints, onset last night and continuing throughout the day today. Patient also reports "sharp" pain in her vagina when she walks. All pain sites almost completely resolve at rest.  Patient also reports spotting yesterday and today. She denies dysuria, back pain, fever or recent illness. Most recent sexual intercourse 2-3 days ago.  OB History     Gravida  2   Para  1   Term  1   Preterm      AB      Living  1      SAB      IAB      Ectopic      Multiple  0   Live Births  1           Past Medical History:  Diagnosis Date   Asthma    Hx of chlamydia infection    Vaginal Pap smear, abnormal     Past Surgical History:  Procedure Laterality Date   NO PAST SURGERIES      Family History  Problem Relation Age of Onset   Asthma Brother    Hypertension Maternal Grandmother    Healthy Mother    Healthy Father    Alcohol abuse Neg Hx    Arthritis Neg Hx    Birth defects Neg Hx    Cancer Neg Hx    COPD Neg Hx    Depression Neg Hx    Diabetes Neg Hx    Drug abuse Neg Hx    Early death Neg Hx    Hearing loss Neg Hx    Hyperlipidemia Neg Hx    Heart disease Neg Hx    Kidney disease Neg Hx    Learning disabilities Neg Hx    Mental illness Neg Hx    Mental retardation Neg Hx    Miscarriages / Stillbirths Neg Hx    Stroke Neg Hx    Vision loss Neg Hx    Varicose Veins Neg Hx     Social History   Tobacco Use   Smoking status: Never   Smokeless tobacco: Never  Vaping Use   Vaping Use: Never used  Substance Use Topics   Alcohol use: No   Drug use: No    Allergies: No Known  Allergies  Medications Prior to Admission  Medication Sig Dispense Refill Last Dose   acetaminophen (TYLENOL) 325 MG tablet Take 2 tablets (650 mg total) by mouth every 6 (six) hours as needed.   Past Week   Prenatal Vit-Fe Fumarate-FA (PRENATAL MULTIVITAMIN) TABS tablet Take 1 tablet by mouth daily at 12 noon.   06/16/2022   cephALEXin (KEFLEX) 500 MG capsule Take 1 capsule (500 mg total) by mouth 2 (two) times daily. 10 capsule 0    ondansetron (ZOFRAN-ODT) 8 MG disintegrating tablet Take 1 tablet (8 mg total) by mouth every 8 (eight) hours as needed for nausea or vomiting. 20 tablet 0    scopolamine (TRANSDERM-SCOP) 1 MG/3DAYS Place 1 patch (1.5 mg total) onto the skin every 3 (three) days. 10 patch 12    terconazole (TERAZOL 7) 0.4 %  vaginal cream Place 1 applicator vaginally at bedtime. 45 g 0     Review of Systems Physical Exam   Blood pressure 105/64, pulse 88, temperature 98.2 F (36.8 C), resp. rate 16, height 5\' 4"  (1.626 m), weight 64 kg, last menstrual period 12/24/2021, SpO2 99 %, unknown if currently breastfeeding.  Physical Exam  MAU Course  Procedures  MDM --Prenatal records not visible in chart --Reactive tracing: baseline 150, mod var, + accels, no decels --Toco: UI  Orders Placed This Encounter  Procedures   Wet prep, genital   Urinalysis, Routine w reflex microscopic Urine, Clean Catch   Discharge patient   Patient Vitals for the past 24 hrs:  BP Temp Pulse Resp SpO2 Height Weight  06/16/22 1743 (!) 102/57 -- 89 -- -- -- --  06/16/22 1644 105/64 -- 88 16 99 % -- --  06/16/22 1614 111/64 98.2 F (36.8 C) 94 18 -- 5\' 4"  (1.626 m) 64 kg   Results for orders placed or performed during the hospital encounter of 06/16/22 (from the past 24 hour(s))  Urinalysis, Routine w reflex microscopic Urine, Clean Catch     Status: Abnormal   Collection Time: 06/16/22  4:09 PM  Result Value Ref Range   Color, Urine YELLOW YELLOW   APPearance HAZY (A) CLEAR   Specific  Gravity, Urine 1.013 1.005 - 1.030   pH 7.0 5.0 - 8.0   Glucose, UA NEGATIVE NEGATIVE mg/dL   Hgb urine dipstick NEGATIVE NEGATIVE   Bilirubin Urine NEGATIVE NEGATIVE   Ketones, ur 20 (A) NEGATIVE mg/dL   Protein, ur NEGATIVE NEGATIVE mg/dL   Nitrite NEGATIVE NEGATIVE   Leukocytes,Ua TRACE (A) NEGATIVE   RBC / HPF 0-5 0 - 5 RBC/hpf   WBC, UA 0-5 0 - 5 WBC/hpf   Bacteria, UA RARE (A) NONE SEEN   Squamous Epithelial / LPF 0-5 0 - 5  Wet prep, genital     Status: Abnormal   Collection Time: 06/16/22  5:15 PM   Specimen: Vaginal  Result Value Ref Range   Yeast Wet Prep HPF POC NONE SEEN NONE SEEN   Trich, Wet Prep NONE SEEN NONE SEEN   Clue Cells Wet Prep HPF POC PRESENT (A) NONE SEEN   WBC, Wet Prep HPF POC >=10 (A) <10   Sperm NONE SEEN    Meds ordered this encounter  Medications   acetaminophen (TYLENOL) tablet 1,000 mg   metroNIDAZOLE (FLAGYL) 500 MG tablet    Sig: Take 1 tablet (500 mg total) by mouth 2 (two) times daily for 7 days.    Dispense:  14 tablet    Refill:  0    Order Specific Question:   Supervising Provider    Answer:   06/18/22 [[id number]]   cyclobenzaprine (FLEXERIL) 10 MG tablet    Sig: Take 1 tablet (10 mg total) by mouth 2 (two) times daily as needed for up to 30 doses for muscle spasms.    Dispense:  20 tablet    Refill:  0    Order Specific Question:   Supervising Provider    Answer:   06/18/22 [phone]   Assessment and Plan  ***  Adam Phenix 06/16/2022, 5:36 PM

## 2022-06-16 NOTE — MAU Note (Signed)
.  Michelle Calhoun is a 26 y.o. at [redacted]w[redacted]d here in MAU reporting: she started having sharp pain in her vaginal area last night that a really bad when she walks. Worse today. Had some spotting yesterday and today no leaking. Good fetal movement felt.  LMP: Onset of complaint: last night Pain score: 7 Vitals:   06/16/22 1614  BP: 111/64  Pulse: 94  Resp: 18  Temp: 98.2 F (36.8 C)     FHT:147 Lab orders placed from triage:   U/a

## 2022-06-22 DIAGNOSIS — Z3480 Encounter for supervision of other normal pregnancy, unspecified trimester: Secondary | ICD-10-CM | POA: Diagnosis not present

## 2022-06-22 LAB — OB RESULTS CONSOLE RPR: RPR: NONREACTIVE

## 2022-07-02 NOTE — L&D Delivery Note (Signed)
Delivery Note Shortly after AROM of clear fluid,  pushing begun due to pt complete. She pushed for 48mins and at 3:14 AM a viable female was delivered via Vaginal, Spontaneous (Presentation: Left Occiput Anterior).  APGAR: 9, 9; weight pending  Anterior shoulder delivered in McRoberts position with 2-3 pushes due to poor maternal effort' posterior shoulder manually delivered with next two pushes.  Vigorous spontaneous cry was noted at delivery. Infants placed on mothers abdomen. Cord was clamped and cut after a minute delay and cord blood obtained.  Placenta status: Spontaneous, Intact.  Delena Bali. Cord: 3 vessels with the following complications: None.  Cord pH: n/a  Anesthesia: Epidural Episiotomy: None Lacerations: Periurethral abrasion only - no repair needed Suture Repair:  n/a Est. Blood Loss (mL): 102  Mom to postpartum.  Baby to Couplet care / Skin to Skin Unsure if desire circumcision - discussed indications, procedure, risks and benefits .  Isaiah Serge 09/17/2022, 3:28 AM

## 2022-07-17 DIAGNOSIS — N760 Acute vaginitis: Secondary | ICD-10-CM | POA: Diagnosis not present

## 2022-07-17 DIAGNOSIS — Z23 Encounter for immunization: Secondary | ICD-10-CM | POA: Diagnosis not present

## 2022-08-06 DIAGNOSIS — D573 Sickle-cell trait: Secondary | ICD-10-CM | POA: Diagnosis not present

## 2022-08-09 ENCOUNTER — Encounter (HOSPITAL_COMMUNITY): Payer: Self-pay | Admitting: Obstetrics and Gynecology

## 2022-08-09 ENCOUNTER — Inpatient Hospital Stay (HOSPITAL_COMMUNITY)
Admission: AD | Admit: 2022-08-09 | Discharge: 2022-08-09 | Disposition: A | Discharge status: Home / Self Care | Payer: Medicaid Other | Attending: Obstetrics and Gynecology | Admitting: Obstetrics and Gynecology

## 2022-08-09 ENCOUNTER — Inpatient Hospital Stay (HOSPITAL_COMMUNITY): Payer: Medicaid Other

## 2022-08-09 DIAGNOSIS — Z20822 Contact with and (suspected) exposure to covid-19: Secondary | ICD-10-CM | POA: Diagnosis not present

## 2022-08-09 DIAGNOSIS — O98513 Other viral diseases complicating pregnancy, third trimester: Secondary | ICD-10-CM | POA: Diagnosis not present

## 2022-08-09 DIAGNOSIS — Z3A32 32 weeks gestation of pregnancy: Secondary | ICD-10-CM | POA: Insufficient documentation

## 2022-08-09 DIAGNOSIS — O368130 Decreased fetal movements, third trimester, not applicable or unspecified: Secondary | ICD-10-CM | POA: Insufficient documentation

## 2022-08-09 DIAGNOSIS — R0602 Shortness of breath: Secondary | ICD-10-CM | POA: Diagnosis not present

## 2022-08-09 DIAGNOSIS — U071 COVID-19: Secondary | ICD-10-CM | POA: Diagnosis not present

## 2022-08-09 HISTORY — DX: Essential (primary) hypertension: I10

## 2022-08-09 LAB — RESP PANEL BY RT-PCR (RSV, FLU A&B, COVID)  RVPGX2
Influenza A by PCR: NEGATIVE
Influenza B by PCR: NEGATIVE
Resp Syncytial Virus by PCR: NEGATIVE
SARS Coronavirus 2 by RT PCR: POSITIVE — AB

## 2022-08-09 LAB — URINALYSIS, ROUTINE W REFLEX MICROSCOPIC
Bilirubin Urine: NEGATIVE
Glucose, UA: NEGATIVE mg/dL
Hgb urine dipstick: NEGATIVE
Ketones, ur: 20 mg/dL — AB
Nitrite: NEGATIVE
Protein, ur: NEGATIVE mg/dL
Specific Gravity, Urine: 1.012 (ref 1.005–1.030)
pH: 6 (ref 5.0–8.0)

## 2022-08-09 NOTE — MAU Provider Note (Signed)
History     CSN: [id number]  Arrival date and time: 08/09/22 [id number]   Event Date/Time   First Provider Initiated Contact with Patient 08/09/22 2020      Chief Complaint  Patient presents with   Decreased Fetal Movement   Sore Throat   Cough   Headache   HPI Michelle Calhoun is a 27 y.o. G2P1001 at 89w4dwho presents to MAU for flu-like symptoms. Patient reports symptoms started last night. She is reporting chills, cough, sore throat, headache, decreased sense of smell, body aches, and shortness of breath. She denies fever, congestion, or chest pain. She reports when she coughs she has a lot of sharp pain in bilateral sides. She has a known Covid exposure and her significant other is sick with similar symptoms. She reports decreased fetal movement today but since arrival to MAU has felt normal movement.  [facility].  OB History     Gravida  2   Para  1   Term  1   Preterm      AB      Living  1      SAB      IAB      Ectopic      Multiple  0   Live Births  1           Past Medical History:  Diagnosis Date   Asthma    Hx of chlamydia infection    Hypertension    Vaginal Pap smear, abnormal     Past Surgical History:  Procedure Laterality Date   NO PAST SURGERIES      Family History  Problem Relation Age of Onset   Asthma Brother    Hypertension Maternal Grandmother    Healthy Mother    Healthy Father    Alcohol abuse Neg Hx    Arthritis Neg Hx    Birth defects Neg Hx    Cancer Neg Hx    COPD Neg Hx    Depression Neg Hx    Diabetes Neg Hx    Drug abuse Neg Hx    Early death Neg Hx    Hearing loss Neg Hx    Hyperlipidemia Neg Hx    Heart disease Neg Hx    Kidney disease Neg Hx    Learning disabilities Neg Hx    Mental illness Neg Hx    Mental retardation Neg Hx    Miscarriages / Stillbirths Neg Hx    Stroke Neg Hx    Vision loss Neg Hx    Varicose Veins Neg Hx      Social History   Tobacco Use   Smoking status: Never   Smokeless tobacco: Never  Vaping Use   Vaping Use: Never used  Substance Use Topics   Alcohol use: No   Drug use: No    Allergies: No Known Allergies  Medications Prior to Admission  Medication Sig Dispense Refill Last Dose   acetaminophen (TYLENOL) 325 MG tablet Take 2 tablets (650 mg total) by mouth every 6 (six) hours as needed.   Past Month   Prenatal Vit-Fe Fumarate-FA (PRENATAL MULTIVITAMIN) TABS tablet Take 1 tablet by mouth daily at 12 noon.   08/09/2022   cyclobenzaprine (FLEXERIL) 10 MG tablet Take 1 tablet (10 mg total) by mouth 2 (two) times daily as needed for up to 30 doses for muscle spasms. (Patient not taking: Reported on 08/09/2022) 20 tablet 0  Not Taking   ondansetron (ZOFRAN-ODT) 8 MG disintegrating tablet Take 1 tablet (8 mg total) by mouth every 8 (eight) hours as needed for nausea or vomiting. (Patient not taking: Reported on 08/09/2022) 20 tablet 0 Not Taking   scopolamine (TRANSDERM-SCOP) 1 MG/3DAYS Place 1 patch (1.5 mg total) onto the skin every 3 (three) days. (Patient not taking: Reported on 08/09/2022) 10 patch 12 Not Taking   Review of Systems  Constitutional:  Positive for chills.  HENT:  Positive for sore throat. Negative for congestion.        Decreased sense of smell  Respiratory:  Positive for cough and shortness of breath.   Cardiovascular: Negative.   Gastrointestinal: Negative.   Genitourinary:  Positive for pelvic pain. Negative for dysuria, vaginal bleeding and vaginal discharge.  Musculoskeletal:  Positive for myalgias.  Neurological:  Positive for headaches.   Physical Exam   Blood pressure 114/63, pulse 100, temperature 98.8 F (37.1 C), resp. rate 17, height 5' 4"$  (1.626 m), weight 64.9 kg, last menstrual period 12/24/2021, SpO2 100 %, unknown if currently breastfeeding.  Physical Exam Vitals and nursing note reviewed.  Constitutional:      General: She is not in acute  distress. HENT:     Nose: No congestion.     Mouth/Throat:     Mouth: Mucous membranes are moist.  Eyes:     Extraocular Movements: Extraocular movements intact.     Pupils: Pupils are equal, round, and reactive to light.  Cardiovascular:     Rate and Rhythm: Regular rhythm. Tachycardia present.     Heart sounds: Normal heart sounds.  Pulmonary:     Effort: Pulmonary effort is normal. No respiratory distress.     Breath sounds: Normal breath sounds. No wheezing or rales.  Abdominal:     Palpations: Abdomen is soft.     Tenderness: There is no abdominal tenderness.     Comments: Gravid   Musculoskeletal:        General: Normal range of motion.     Cervical back: Normal range of motion.  Skin:    General: Skin is warm and dry.  Neurological:     General: No focal deficit present.     Mental Status: She is alert and oriented to person, place, and time.  Psychiatric:        Mood and Affect: Mood normal.        Behavior: Behavior normal.    NST FHR: 150 bpm, moderate variability, +15x15 accels, no decels Toco: occ ui  Results for orders placed or performed during the hospital encounter of 08/09/22 (from the past 24 hour(s))  Urinalysis, Routine w reflex microscopic -Urine, Clean Catch     Status: Abnormal   Collection Time: 08/09/22  8:00 PM  Result Value Ref Range   Color, Urine YELLOW YELLOW   APPearance HAZY (A) CLEAR   Specific Gravity, Urine 1.012 1.005 - 1.030   pH 6.0 5.0 - 8.0   Glucose, UA NEGATIVE NEGATIVE mg/dL   Hgb urine dipstick NEGATIVE NEGATIVE   Bilirubin Urine NEGATIVE NEGATIVE   Ketones, ur 20 (A) NEGATIVE mg/dL   Protein, ur NEGATIVE NEGATIVE mg/dL   Nitrite NEGATIVE NEGATIVE   Leukocytes,Ua SMALL (A) NEGATIVE   RBC / HPF 0-5 0 - 5 RBC/hpf   WBC, UA 0-5 0 - 5 WBC/hpf   Bacteria, UA RARE (A) NONE SEEN   Squamous Epithelial / HPF 6-10 0 - 5 /HPF   Mucus PRESENT   Resp panel by RT-PCR (  RSV, Flu A&B, Covid) Anterior Nasal Swab     Status: Abnormal    Collection Time: 08/09/22  8:08 PM   Specimen: Anterior Nasal Swab  Result Value Ref Range   SARS Coronavirus 2 by RT PCR POSITIVE (A) NEGATIVE   Influenza A by PCR NEGATIVE NEGATIVE   Influenza B by PCR NEGATIVE NEGATIVE   Resp Syncytial Virus by PCR NEGATIVE NEGATIVE   DG Chest Portable 1 View  Result Date: 08/09/2022 CLINICAL DATA:  Shortness of breath.  COVID exposure. EXAM: PORTABLE CHEST 1 VIEW COMPARISON:  Chest radiograph dated 07/20/2021. FINDINGS: Shallow inspiration. No focal consolidation, pleural effusion, or pneumothorax. The cardiac silhouette is within normal limits. No acute osseous pathology. IMPRESSION: No active disease. Electronically Signed   By: Anner Crete M.D.   On: 08/09/2022 21:02    MAU Course  Procedures  MDM UA Covid/Flu/RSV swab Chest x-ray NST  UA unremarkable. Chest x-ray normal. VSS, patient afebrile, mild tachycardia present. O2 sats 100%. Covid test pending- patient may be discharged home and will contact at later time regarding results. Instructed that she may also check MyChart. Patient instructed to quarantine at home until results have been given.   Addendum: Patient test positive for covid. Patient instructed to quarantine per CDC guidelines. Strict return precautions given.   Assessment and Plan   1. [redacted] weeks gestation of pregnancy   2. Exposure to COVID-19 virus   3. COVID-19 affecting pregnancy in third trimester    - Discharge in stable condition - Return precautions given - Return to MAU for new/worsening symptoms - Keep OB appointment as scheduled on 2/26  Renee Harder, [place] 08/09/2022, 9:48 PM

## 2022-08-09 NOTE — MAU Note (Addendum)
.  Michelle Calhoun is a 27 y.o. at [redacted]w[redacted]d here in MAU reporting cough, sore throat, runny nose, decrease smell, body aches since last night. Decreased FM since 12noon. Some pain in lower abd when coughs. Denies LOF or VB. Pt also reports a headache. Has not taken any meds for symptoms  Onset of complaint: last night Pain score: 10 Vitals:   08/09/22 1947 08/09/22 1949  BP:  111/61  Pulse: (!) 104   Resp: 17   Temp: 98.8 F (37.1 C)   SpO2: 100%      FHT:161 Lab orders placed from triage:  uri

## 2022-08-09 NOTE — MAU Note (Signed)
Clicker given to pt to mark FM 

## 2022-08-09 NOTE — Discharge Instructions (Signed)

## 2022-08-09 NOTE — Progress Notes (Signed)
Danielle Simpson CNM in earlier to discuss d/c plan. Written and verbal d/c instructions given and understanding voiced. 

## 2022-08-29 DIAGNOSIS — H5213 Myopia, bilateral: Secondary | ICD-10-CM | POA: Diagnosis not present

## 2022-08-31 DIAGNOSIS — Z419 Encounter for procedure for purposes other than remedying health state, unspecified: Secondary | ICD-10-CM | POA: Diagnosis not present

## 2022-09-05 DIAGNOSIS — Z3480 Encounter for supervision of other normal pregnancy, unspecified trimester: Secondary | ICD-10-CM | POA: Diagnosis not present

## 2022-09-05 LAB — OB RESULTS CONSOLE GBS: GBS: NEGATIVE

## 2022-09-12 DIAGNOSIS — Z3482 Encounter for supervision of other normal pregnancy, second trimester: Secondary | ICD-10-CM | POA: Diagnosis not present

## 2022-09-12 DIAGNOSIS — Z3483 Encounter for supervision of other normal pregnancy, third trimester: Secondary | ICD-10-CM | POA: Diagnosis not present

## 2022-09-13 ENCOUNTER — Other Ambulatory Visit: Payer: Self-pay | Admitting: Obstetrics and Gynecology

## 2022-09-13 DIAGNOSIS — Z3490 Encounter for supervision of normal pregnancy, unspecified, unspecified trimester: Secondary | ICD-10-CM

## 2022-09-16 ENCOUNTER — Inpatient Hospital Stay (HOSPITAL_COMMUNITY): Payer: Medicaid Other | Admitting: Anesthesiology

## 2022-09-16 ENCOUNTER — Encounter (HOSPITAL_COMMUNITY): Payer: Self-pay | Admitting: Obstetrics and Gynecology

## 2022-09-16 ENCOUNTER — Inpatient Hospital Stay (EMERGENCY_DEPARTMENT_HOSPITAL)
Admission: AD | Admit: 2022-09-16 | Discharge: 2022-09-16 | Disposition: A | Discharge status: Home / Self Care | Payer: Medicaid Other | Source: Home / Self Care | Attending: Obstetrics and Gynecology | Admitting: Obstetrics and Gynecology

## 2022-09-16 ENCOUNTER — Inpatient Hospital Stay (HOSPITAL_COMMUNITY)
Admission: AD | Admit: 2022-09-16 | Discharge: 2022-09-19 | DRG: 807 | Disposition: A | Discharge status: Home / Self Care | Payer: Medicaid Other | Attending: Obstetrics and Gynecology | Admitting: Obstetrics and Gynecology

## 2022-09-16 DIAGNOSIS — Z3689 Encounter for other specified antenatal screening: Secondary | ICD-10-CM

## 2022-09-16 DIAGNOSIS — B379 Candidiasis, unspecified: Secondary | ICD-10-CM | POA: Diagnosis present

## 2022-09-16 DIAGNOSIS — O9882 Other maternal infectious and parasitic diseases complicating childbirth: Principal | ICD-10-CM | POA: Diagnosis present

## 2022-09-16 DIAGNOSIS — O479 False labor, unspecified: Secondary | ICD-10-CM

## 2022-09-16 DIAGNOSIS — Z3A38 38 weeks gestation of pregnancy: Secondary | ICD-10-CM

## 2022-09-16 DIAGNOSIS — O26893 Other specified pregnancy related conditions, third trimester: Secondary | ICD-10-CM | POA: Insufficient documentation

## 2022-09-16 DIAGNOSIS — J45909 Unspecified asthma, uncomplicated: Secondary | ICD-10-CM | POA: Diagnosis not present

## 2022-09-16 DIAGNOSIS — O9952 Diseases of the respiratory system complicating childbirth: Secondary | ICD-10-CM | POA: Diagnosis not present

## 2022-09-16 DIAGNOSIS — Z3490 Encounter for supervision of normal pregnancy, unspecified, unspecified trimester: Principal | ICD-10-CM | POA: Diagnosis present

## 2022-09-16 DIAGNOSIS — O164 Unspecified maternal hypertension, complicating childbirth: Secondary | ICD-10-CM | POA: Diagnosis not present

## 2022-09-16 LAB — CBC
HCT: 30.4 % — ABNORMAL LOW (ref 36.0–46.0)
Hemoglobin: 10.1 g/dL — ABNORMAL LOW (ref 12.0–15.0)
MCH: 26.6 pg (ref 26.0–34.0)
MCHC: 33.2 g/dL (ref 30.0–36.0)
MCV: 80.2 fL (ref 80.0–100.0)
Platelets: 305 10*3/uL (ref 150–400)
RBC: 3.79 MIL/uL — ABNORMAL LOW (ref 3.87–5.11)
RDW: 14.8 % (ref 11.5–15.5)
WBC: 8.3 10*3/uL (ref 4.0–10.5)
nRBC: 0 % (ref 0.0–0.2)

## 2022-09-16 LAB — POCT FERN TEST: POCT Fern Test: NEGATIVE

## 2022-09-16 MED ORDER — OXYTOCIN-SODIUM CHLORIDE 30-0.9 UT/500ML-% IV SOLN: 1.0000 m[IU]/min | INTRAVENOUS | Status: DC

## 2022-09-16 MED ORDER — DIPHENHYDRAMINE HCL 50 MG/ML IJ SOLN: 12.5000 mg | INTRAMUSCULAR | Status: DC | PRN

## 2022-09-16 MED ORDER — EPHEDRINE 5 MG/ML INJ: 10.0000 mg | INTRAVENOUS | Status: DC | PRN

## 2022-09-16 MED ORDER — FENTANYL CITRATE (PF) 100 MCG/2ML IJ SOLN
50.0000 ug | INTRAMUSCULAR | Status: DC | PRN
  Filled 2022-09-16: qty 2

## 2022-09-16 MED ORDER — FENTANYL-BUPIVACAINE-NACL 0.5-0.125-0.9 MG/250ML-% EP SOLN
12.0000 mL/h | EPIDURAL | Status: DC | PRN
  Administered 2022-09-16: 12 mL/h via EPIDURAL
  Filled 2022-09-16: qty 250

## 2022-09-16 MED ORDER — OXYCODONE-ACETAMINOPHEN 5-325 MG PO TABS: 2.0000 | ORAL_TABLET | ORAL | Status: DC | PRN

## 2022-09-16 MED ORDER — LACTATED RINGERS IV SOLN: INTRAVENOUS | Status: DC

## 2022-09-16 MED ORDER — LACTATED RINGERS IV SOLN: 500.0000 mL | INTRAVENOUS | Status: DC | PRN

## 2022-09-16 MED ORDER — SOD CITRATE-CITRIC ACID 500-334 MG/5ML PO SOLN: 30.0000 mL | ORAL | Status: DC | PRN

## 2022-09-16 MED ORDER — LIDOCAINE HCL (PF) 1 % IJ SOLN
INTRAMUSCULAR | Status: DC | PRN
  Administered 2022-09-16: 11 mL via EPIDURAL

## 2022-09-16 MED ORDER — PHENYLEPHRINE 80 MCG/ML (10ML) SYRINGE FOR IV PUSH (FOR BLOOD PRESSURE SUPPORT): 80.0000 ug | PREFILLED_SYRINGE | INTRAVENOUS | Status: DC | PRN

## 2022-09-16 MED ORDER — OXYTOCIN BOLUS FROM INFUSION
333.0000 mL | Freq: Once | INTRAVENOUS | Status: AC
  Administered 2022-09-17: 333 mL via INTRAVENOUS

## 2022-09-16 MED ORDER — LIDOCAINE HCL (PF) 1 % IJ SOLN: 30.0000 mL | INTRAMUSCULAR | Status: DC | PRN

## 2022-09-16 MED ORDER — ACETAMINOPHEN 325 MG PO TABS: 650.0000 mg | ORAL_TABLET | ORAL | Status: DC | PRN

## 2022-09-16 MED ORDER — LACTATED RINGERS IV SOLN
500.0000 mL | Freq: Once | INTRAVENOUS | Status: AC
  Administered 2022-09-16: 500 mL via INTRAVENOUS

## 2022-09-16 MED ORDER — OXYTOCIN-SODIUM CHLORIDE 30-0.9 UT/500ML-% IV SOLN
2.5000 [IU]/h | INTRAVENOUS | Status: DC
  Filled 2022-09-16: qty 500

## 2022-09-16 MED ORDER — TERBUTALINE SULFATE 1 MG/ML IJ SOLN: 0.2500 mg | Freq: Once | INTRAMUSCULAR | Status: DC | PRN

## 2022-09-16 MED ORDER — ONDANSETRON HCL 4 MG/2ML IJ SOLN
4.0000 mg | Freq: Four times a day (QID) | INTRAMUSCULAR | Status: DC | PRN
  Administered 2022-09-17: 4 mg via INTRAVENOUS
  Filled 2022-09-16: qty 2

## 2022-09-16 MED ORDER — FENTANYL CITRATE (PF) 100 MCG/2ML IJ SOLN
100.0000 ug | Freq: Once | INTRAMUSCULAR | Status: AC
  Administered 2022-09-16: 100 ug via INTRAVENOUS

## 2022-09-16 MED ORDER — OXYCODONE-ACETAMINOPHEN 5-325 MG PO TABS: 1.0000 | ORAL_TABLET | ORAL | Status: DC | PRN

## 2022-09-16 NOTE — MAU Note (Signed)
.  Michelle Calhoun is a 27 y.o. at [redacted]w[redacted]d here in MAU reporting: return to MAU after labor evaluation earlier today, CTX continued and are moire intense and frequent. Pt report some sticky clear discharge leaking. Pt report blurry vision and fatigue. Pt denies DFM, VB, LOF, recent intercourse, PIH s/s, and complications in the pregnancy.  SVE today 2.5/70  GBS neg  Onset of complaint: 0900 Pain score: 10/10 Vitals:   09/16/22 2236  BP: 130/87  Pulse: 96  Resp: 18  SpO2: 100%     FHT:140 Lab orders placed from triage:

## 2022-09-16 NOTE — MAU Provider Note (Signed)
Event Date/Time   First Provider Initiated Contact with Patient 09/16/22 1256       S: Ms. Michelle Calhoun is a 27 y.o. G2P1001 at [redacted]w[redacted]d  who presents to MAU today complaining of leaking of fluid since 0100. She denies vaginal bleeding. She endorses contractions q 15-20 minutes since 0900 today. She reports normal fetal movement.    Patient states she was 2-3 cm in office this week. She is being treated for a yeast infection and administered Terazol PV last night.  O: BP 115/79   Pulse (!) 102   Temp 98.4 F (36.9 C) (Oral)   Resp 18   Ht 5\' 4"  (1.626 m)   Wt 66.7 kg   LMP 12/24/2021   SpO2 100%   BMI 25.23 kg/m  GENERAL: Well-developed, well-nourished female in no acute distress.  HEAD: Normocephalic, atraumatic.  CHEST: Normal effort of breathing, regular heart rate ABDOMEN: Soft, nontender, gravid PELVIC: Normal external female genitalia. Vagina is pink and rugated. Cervix with normal contour, no lesions. Unable to assess for discharge given presence of residual Terazol cream. Negative pooling. Negative increase in contents with Valsalva.  Cervical exam:  Dilation: 2.5 Effacement (%): 70 Station: Ballotable Exam by:: S Jontavius Rabalais CNM   Fetal Monitoring: Baseline: 135 Variability: Mod Accelerations: 15 x 15 Decelerations: None Contractions: q 3- 9 min  Results for orders placed or performed during the hospital encounter of 09/16/22 (from the past 24 hour(s))  Fern Test     Status: Abnormal   Collection Time: 09/16/22 11:47 AM  Result Value Ref Range   POCT Fern Test Negative = intact amniotic membranes      A: SIUP at [redacted]w[redacted]d  Cat I tracing Intact membranes Cervix remains 2.5/70/ballotable 90 min after original exam  P: Discharge home in stable condition with labor precautions  Mallie Snooks, [place], MSN, CNM 09/16/2022 1:46 PM

## 2022-09-16 NOTE — MAU Note (Signed)
.  Michelle Calhoun is a 27 y.o. at [redacted]w[redacted]d here in MAU reporting: contractions starting at 0900 every 15/20 minutes. Patient reports LOF starting early this morning around 0100, reports clear fluids.  No VB, endorses +FM.   Onset of complaint: 0100 - LOF, 0900- ctxn  Pain score: 10/10 Vitals:   09/16/22 1110 09/16/22 1114  BP:  115/79  Pulse:  (!) 102  Resp:  18  Temp:  98.4 F (36.9 C)  SpO2: 100% 100%     FHT:130 Lab orders placed from triage:  fern

## 2022-09-16 NOTE — Anesthesia Preprocedure Evaluation (Signed)
Anesthesia Evaluation  Patient identified by MRN, date of birth, ID band Patient awake    Reviewed: Allergy & Precautions, H&P , NPO status , Patient's Chart, lab work & pertinent test results  Airway Mallampati: I  TM Distance: >3 FB Neck ROM: full    Dental no notable dental hx.    Pulmonary asthma    Pulmonary exam normal        Cardiovascular hypertension, negative cardio ROS Normal cardiovascular exam     Neuro/Psych negative neurological ROS  negative psych ROS   GI/Hepatic negative GI ROS, Neg liver ROS,  Medicated and Controlled,,  Endo/Other  negative endocrine ROS    Renal/GU negative Renal ROS  negative genitourinary   Musculoskeletal negative musculoskeletal ROS (+)    Abdominal Normal abdominal exam  (+)   Peds negative pediatric ROS (+)  Hematology negative hematology ROS (+)   Anesthesia Other Findings   Reproductive/Obstetrics (+) Pregnancy                             Anesthesia Physical Anesthesia Plan  ASA: II  Anesthesia Plan: Epidural   Post-op Pain Management:    Induction:   PONV Risk Score and Plan:   Airway Management Planned:   Additional Equipment:   Intra-op Plan:   Post-operative Plan:   Informed Consent: I have reviewed the patients History and Physical, chart, labs and discussed the procedure including the risks, benefits and alternatives for the proposed anesthesia with the patient or authorized representative who has indicated his/her understanding and acceptance.       Plan Discussed with:   Anesthesia Plan Comments:         Anesthesia Quick Evaluation

## 2022-09-16 NOTE — Anesthesia Procedure Notes (Signed)
Epidural Patient location during procedure: OB Start time: 09/16/2022 11:35 PM End time: 09/16/2022 11:51 PM  Staffing Anesthesiologist: Lynda Rainwater, MD Performed: anesthesiologist   Preanesthetic Checklist Completed: patient identified, IV checked, site marked, risks and benefits discussed, surgical consent, monitors and equipment checked, pre-op evaluation and timeout performed  Epidural Patient position: sitting Prep: ChloraPrep Patient monitoring: heart rate, cardiac monitor, continuous pulse ox and blood pressure Approach: midline Location: L2-L3 Injection technique: LOR saline  Needle:  Needle type: Tuohy  Needle gauge: 17 G Needle length: 9 cm Needle insertion depth: 5 cm Catheter type: closed end flexible Catheter size: 20 Guage Catheter at skin depth: 9 cm Test dose: negative  Assessment Events: blood not aspirated, injection not painful, no injection resistance, no paresthesia and negative IV test  Additional Notes Reason for block:procedure for pain

## 2022-09-16 NOTE — MAU Note (Signed)
I have communicated with Mallie Snooks CNM and reviewed vital signs:  Vitals:   09/16/22 1205 09/16/22 1308  BP:  129/68  Pulse:  92  Resp:    Temp:    SpO2: 100%     Vaginal exam:  Dilation: 2.5 Effacement (%): 70 Station: Ballotable Exam by:: [employer] CNM,   Also reviewed contraction pattern and that non-stress test is reactive.  It has been documented that patient is contracting every 2.5-6 minutes with no cervical change over 1.5 hours not indicating active labor.  Patient denies any other complaints.  Based on this report provider has given order for discharge.  A discharge order and diagnosis entered by a provider.   Labor discharge instructions reviewed with patient.

## 2022-09-16 NOTE — Discharge Instructions (Signed)
[place]

## 2022-09-17 ENCOUNTER — Encounter (HOSPITAL_COMMUNITY): Payer: Self-pay | Admitting: Obstetrics and Gynecology

## 2022-09-17 DIAGNOSIS — Z3A38 38 weeks gestation of pregnancy: Secondary | ICD-10-CM | POA: Diagnosis not present

## 2022-09-17 LAB — CBC
HCT: 29.5 % — ABNORMAL LOW (ref 36.0–46.0)
Hemoglobin: 9.7 g/dL — ABNORMAL LOW (ref 12.0–15.0)
MCH: 26.3 pg (ref 26.0–34.0)
MCHC: 32.9 g/dL (ref 30.0–36.0)
MCV: 79.9 fL — ABNORMAL LOW (ref 80.0–100.0)
Platelets: 290 10*3/uL (ref 150–400)
RBC: 3.69 MIL/uL — ABNORMAL LOW (ref 3.87–5.11)
RDW: 14.6 % (ref 11.5–15.5)
WBC: 11.8 10*3/uL — ABNORMAL HIGH (ref 4.0–10.5)
nRBC: 0 % (ref 0.0–0.2)

## 2022-09-17 LAB — TYPE AND SCREEN
ABO/RH(D): A POS
Antibody Screen: NEGATIVE

## 2022-09-17 LAB — RPR: RPR Ser Ql: NONREACTIVE

## 2022-09-17 MED ORDER — DIPHENHYDRAMINE HCL 25 MG PO CAPS: 25.0000 mg | ORAL_CAPSULE | Freq: Four times a day (QID) | ORAL | Status: DC | PRN

## 2022-09-17 MED ORDER — PRENATAL MULTIVITAMIN CH
1.0000 | ORAL_TABLET | Freq: Every day | ORAL | Status: DC
  Administered 2022-09-17 – 2022-09-18 (×2): 1 via ORAL
  Filled 2022-09-17 (×2): qty 1

## 2022-09-17 MED ORDER — LACTATED RINGERS IV SOLN: INTRAVENOUS | Status: DC

## 2022-09-17 MED ORDER — SIMETHICONE 80 MG PO CHEW: 80.0000 mg | CHEWABLE_TABLET | ORAL | Status: DC | PRN

## 2022-09-17 MED ORDER — OXYCODONE HCL 5 MG PO TABS
10.0000 mg | ORAL_TABLET | ORAL | Status: DC | PRN
  Administered 2022-09-17 – 2022-09-19 (×5): 10 mg via ORAL
  Filled 2022-09-17 (×6): qty 2

## 2022-09-17 MED ORDER — ZOLPIDEM TARTRATE 5 MG PO TABS: 5.0000 mg | ORAL_TABLET | Freq: Every evening | ORAL | Status: DC | PRN

## 2022-09-17 MED ORDER — COCONUT OIL OIL
1.0000 | TOPICAL_OIL | Status: DC | PRN
  Administered 2022-09-18 – 2022-09-19 (×2): 1 via TOPICAL

## 2022-09-17 MED ORDER — BENZOCAINE-MENTHOL 20-0.5 % EX AERO
1.0000 | INHALATION_SPRAY | CUTANEOUS | Status: DC | PRN
  Administered 2022-09-17 – 2022-09-19 (×3): 1 via TOPICAL
  Filled 2022-09-17 (×3): qty 56

## 2022-09-17 MED ORDER — IBUPROFEN 600 MG PO TABS
600.0000 mg | ORAL_TABLET | Freq: Four times a day (QID) | ORAL | Status: DC
  Administered 2022-09-17 – 2022-09-19 (×10): 600 mg via ORAL
  Filled 2022-09-17 (×10): qty 1

## 2022-09-17 MED ORDER — OXYTOCIN-SODIUM CHLORIDE 30-0.9 UT/500ML-% IV SOLN: 2.5000 [IU]/h | INTRAVENOUS | Status: DC | PRN

## 2022-09-17 MED ORDER — TETANUS-DIPHTH-ACELL PERTUSSIS 5-2.5-18.5 LF-MCG/0.5 IM SUSY: 0.5000 mL | PREFILLED_SYRINGE | Freq: Once | INTRAMUSCULAR | Status: DC

## 2022-09-17 MED ORDER — OXYCODONE HCL 5 MG PO TABS
5.0000 mg | ORAL_TABLET | ORAL | Status: DC | PRN
  Administered 2022-09-17 – 2022-09-19 (×3): 5 mg via ORAL
  Filled 2022-09-17 (×2): qty 1

## 2022-09-17 MED ORDER — SENNOSIDES-DOCUSATE SODIUM 8.6-50 MG PO TABS
2.0000 | ORAL_TABLET | Freq: Every day | ORAL | Status: DC
  Administered 2022-09-18 – 2022-09-19 (×2): 2 via ORAL
  Filled 2022-09-17 (×2): qty 2

## 2022-09-17 MED ORDER — ONDANSETRON HCL 4 MG/2ML IJ SOLN: 4.0000 mg | INTRAMUSCULAR | Status: DC | PRN

## 2022-09-17 MED ORDER — ONDANSETRON HCL 4 MG PO TABS: 4.0000 mg | ORAL_TABLET | ORAL | Status: DC | PRN

## 2022-09-17 MED ORDER — DIBUCAINE (PERIANAL) 1 % EX OINT: 1.0000 | TOPICAL_OINTMENT | CUTANEOUS | Status: DC | PRN

## 2022-09-17 MED ORDER — ACETAMINOPHEN 325 MG PO TABS
650.0000 mg | ORAL_TABLET | ORAL | Status: DC | PRN
  Administered 2022-09-17 – 2022-09-18 (×4): 650 mg via ORAL
  Filled 2022-09-17 (×4): qty 2

## 2022-09-17 MED ORDER — WITCH HAZEL-GLYCERIN EX PADS: 1.0000 | MEDICATED_PAD | CUTANEOUS | Status: DC | PRN

## 2022-09-17 NOTE — H&P (Signed)
Michelle Calhoun is a 27 y.o.G2P1001 female presenting in active labor ( 5cm dil) at 56 1/[redacted]wks gestation. Pt was in MAU earlier in day at 2cm dil; returned at 5cm. She is dated per LMP which was confirmed with a 9week [place].  Her pregnancy has been complicated by SST - FOB testing advised. She is GBS neg. NiPT expected range  OB History     Gravida  2   Para  1   Term  1   Preterm      AB      Living  1      SAB      IAB      Ectopic      Multiple  0   Live Births  1          Past Medical History:  Diagnosis Date   Asthma    Hx of chlamydia infection    Hypertension    Vaginal Pap smear, abnormal    Past Surgical History:  Procedure Laterality Date   NO PAST SURGERIES     Family History: family history includes Asthma in her brother; Healthy in her father and mother; Hypertension in her maternal grandmother. Social History:  reports that she has never smoked. She has never used smokeless tobacco. She reports that she does not drink alcohol and does not use drugs.     Maternal Diabetes: No Genetic Screening: Normal Maternal Ultrasounds/Referrals: Normal Fetal Ultrasounds or other Referrals:  None Maternal Substance Abuse:  No Significant Maternal Medications:  None Significant Maternal Lab Results:  Group B Strep negative Number of Prenatal Visits:greater than 3 verified prenatal visits Other Comments:  None  Review of Systems  Constitutional:  Positive for activity change and fatigue.  Eyes:  Negative for photophobia and visual disturbance.  Respiratory:  Negative for chest tightness and shortness of breath.   Cardiovascular:  Positive for leg swelling. Negative for chest pain and palpitations.  Gastrointestinal:  Positive for abdominal pain. Negative for nausea.  Genitourinary:  Positive for pelvic pain. Negative for vaginal bleeding.  Neurological:  Negative for light-headedness and headaches.  Psychiatric/Behavioral:  The patient is not  nervous/anxious.    Maternal Medical History:  Reason for admission: Contractions.  Nausea.  Contractions: Onset was 6-12 hours ago.   Frequency: regular.   Perceived severity is strong.   Fetal activity: Perceived fetal activity is normal.   Prenatal complications: no prenatal complications Prenatal Complications - Diabetes: none.   Dilation: Lip/rim Effacement (%): 100 Station: Plus 1 Exam by:: S. Wineman, RN Blood pressure 118/73, pulse 87, temperature 98.1 F (36.7 C), temperature source Oral, resp. rate 18, height 5\' 4"  (1.626 m), weight 67.9 kg, last menstrual period 12/24/2021, SpO2 100 %, unknown if currently breastfeeding. Maternal Exam:  Uterine Assessment: Contraction strength is moderate.  Contraction frequency is regular.  Abdomen: Patient reports generalized tenderness.  Estimated fetal weight is AGA.   Fetal presentation: vertex Introitus: Normal vulva. Vulva is negative for condylomata and lesion.  Normal vagina.  Vagina is negative for condylomata.  Pelvis: adequate for delivery.   Cervix: Cervix evaluated by digital exam.     Fetal Exam Fetal Monitor Review: Baseline rate: 110.  Variability: moderate (6-25 bpm).   Pattern: accelerations present and no decelerations.   Fetal State Assessment: Category I - tracings are normal.   Physical Exam Vitals and nursing note reviewed. Exam conducted with a chaperone present.  Constitutional:      Appearance: Normal appearance. She is normal  weight.  Cardiovascular:     Pulses: Normal pulses.  Pulmonary:     Effort: Pulmonary effort is normal.  Abdominal:     Tenderness: There is generalized abdominal tenderness.  Genitourinary:    General: Normal vulva.  Vulva is no lesion.  Musculoskeletal:        General: Normal range of motion.     Cervical back: Normal range of motion.  Skin:    General: Skin is warm and dry.     Capillary Refill: Capillary refill takes 2 to 3 seconds.  Neurological:     General:  No focal deficit present.     Mental Status: She is alert and oriented to person, place, and time. Mental status is at baseline.  Psychiatric:        Mood and Affect: Mood normal.        Behavior: Behavior normal.        Thought Content: Thought content normal.     Prenatal labs: ABO, Rh: --/--/A POS (03/17 2245) Antibody: NEG (03/17 2245) Rubella:   RPR:    HBsAg:    HIV:    GBS:     Assessment/Plan: 26yo G2P1001 at 69 1/7wks in active labor - Progressed quickly to 9.5cm and comfortable with epidural  - Plan AROM and anticipate svd  - expectant mgmt    Venetia Night Coury Grieger 09/17/2022, 2:46 AM

## 2022-09-17 NOTE — Progress Notes (Signed)
Patient is doing well.  She is ambulating, voiding, tolerating PO.  Pain control is good.  Lochia is appropriate  Vitals:   09/17/22 0401 09/17/22 0416 09/17/22 0500 09/17/22 0608  BP: 108/63 90/64 132/82 128/75  Pulse: 72 84 77 84  Resp:   18 18  Temp:   (!) 97.5 F (36.4 C) 97.8 F (36.6 C)  TempSrc:   Oral Axillary  SpO2:   100% 100%  Weight:      Height:        NAD Fundus firm Ext:   Lab Results  Component Value Date   WBC 11.8 (H) 09/17/2022   HGB 9.7 (L) 09/17/2022   HCT 29.5 (L) 09/17/2022   MCV 79.9 (L) 09/17/2022   PLT 290 09/17/2022    --/--/A POS (03/17 2245)  A/P 27 y.o. G2P2002 PPD#0 s/p TSVD. Routine care.   Expect d/c tomorrow     [place]

## 2022-09-17 NOTE — Anesthesia Postprocedure Evaluation (Signed)
Anesthesia Post Note  Patient: Michelle Calhoun  Procedure(s) Performed: AN AD [place]     Patient location during evaluation: Mother Baby Anesthesia Type: Epidural Level of consciousness: awake and alert Pain management: pain level controlled Vital Signs Assessment: post-procedure vital signs reviewed and stable Respiratory status: spontaneous breathing, nonlabored ventilation and respiratory function stable Cardiovascular status: stable Postop Assessment: no headache, no backache and epidural receding Anesthetic complications: no   No notable events documented.  Last Vitals:  Vitals:   09/17/22 0500 09/17/22 0608  BP: 132/82 128/75  Pulse: 77 84  Resp: 18 18  Temp: (!) 36.4 C 36.6 C  SpO2: 100% 100%    Last Pain:  Vitals:   09/17/22 0608  TempSrc: Axillary  PainSc: 0-No pain   Pain Goal:                   Ailene Ards

## 2022-09-17 NOTE — Lactation Note (Signed)
This note was copied from a baby's chart. Lactation Consultation Note  Patient Name: Michelle Calhoun [id number] Date: 09/17/2022 Age:27 hours . 2nd Baby  Reason for consult: Initial assessment;Early term 37-38.6wks;Breastfeeding assistance Per mom old BF x 2 weeks and difficulty with engorgement. Her older child is 40 years old  LC offered to assist and mom receptive. LC changed a small black stool.  LC placed baby STS on the left breast, baby opened wide and latched with depth, few swallows. Fed for 11 mins and released relaxed.  Nipple well rounded when baby released. Latch score 8 . Per mom comfortable.  LC reviewed BF feeding goals for 24 hours feed with feeding cues and by 3 hours check the diaper, change if needed , and STS .  Mom aware to call if needing assistance to latch.    Maternal Data - per mom breast changes with pregnancy  Has patient been taught Hand Expression?: Yes Does the patient have breastfeeding experience prior to this delivery?: Yes How long did the patient breastfeed?: per mom 2 weeks  Feeding Mother's Current Feeding Choice: Breast Milk  LATCH Score Latch: Grasps breast easily, tongue down, lips flanged, rhythmical sucking.  Audible Swallowing: A few with stimulation  Type of Nipple: Everted at rest and after stimulation  Comfort (Breast/Nipple): Soft / non-tender  Hold (Positioning): Assistance needed to correctly position infant at breast and maintain latch.  LATCH Score: 8   Lactation Tools Discussed/Used  Will need a hand pump prior to D/C.   Interventions Interventions: Breast feeding basics reviewed;Assisted with latch;Skin to skin;Breast massage;Hand express;Breast compression;Adjust position;Support pillows;Position options  Discharge Pump: DEBP;Personal (per mom [place])  Consult Status Consult Status: Follow-up Date: 09/18/22 Follow-up type: In-patient    [place] 09/17/2022, 8:39 AM

## 2022-09-18 NOTE — Progress Notes (Signed)
PPD #1 No problems Afeb, VSS Fundus firm, NT at U-1 Continue routine postpartum care 

## 2022-09-18 NOTE — Lactation Note (Addendum)
This note was copied from a baby's chart. Lactation Consultation Note  Patient Name: Michelle Calhoun [id number] Date: 09/18/2022 Age:27 hours Reason for consult: Follow-up assessment  P2, Mother requested assistance with latching.  Mother needed review of hand expression and had good flow.  Mother has small crack on L nipple.  Calmed baby with glove finger and changed diaper. Baby latched in both cross cradle and football hold.  Baby fed in cross cradle hold for 10 min.  Changed diaper and helped latch in football hold on L breast.   Reviewed hand pump use, fitted with 21 flange and suggest prepumping if she has diffculty latching. Provided coconut oil and comfort gels.  Suggest alternating for soreness.  Maternal Data Has patient been taught Hand Expression?: Yes Does the patient have breastfeeding experience prior to this delivery?: Yes  Feeding Mother's Current Feeding Choice: Breast Milk  LATCH Score Latch: Repeated attempts needed to sustain latch, nipple held in mouth throughout feeding, stimulation needed to elicit sucking reflex.  Audible Swallowing: A few with stimulation  Type of Nipple: Everted at rest and after stimulation  Comfort (Breast/Nipple): Soft / non-tender  Hold (Positioning): Assistance needed to correctly position infant at breast and maintain latch.  LATCH Score: 7   Lactation Tools Discussed/Used Tools: Pump Breast pump type: Manual Reason for Pumping: stimulation Pumping frequency: PRN  Interventions Interventions: Assisted with latch;Breast feeding basics reviewed;Skin to skin;Hand express;Pre-pump if needed;Hand pump;Education  Discharge    Consult Status Consult Status: Follow-up Date: 09/18/22 Follow-up type: In-patient    Vivianne Master [facility] 09/18/2022, 2:38 PM

## 2022-09-19 MED ORDER — IBUPROFEN 600 MG PO TABS: 600.0000 mg | ORAL_TABLET | Freq: Four times a day (QID) | ORAL | 0 refills | Status: DC | PRN

## 2022-09-19 NOTE — Discharge Summary (Signed)
Postpartum Discharge Summary       Patient Name: Michelle Calhoun DOB: 1996/04/04 MRN: [id number]  Date of admission: 09/16/2022 Delivery date:09/17/2022  Delivering provider: Carlynn Purl [place]  Date of discharge: 09/19/2022  Admitting diagnosis: Encounter for elective induction of labor [Z34.90] Intrauterine pregnancy: [redacted]w[redacted]d     Secondary diagnosis:  Principal Problem:   Encounter for elective induction of labor Active Problems:   SVD (spontaneous vaginal delivery)     Discharge diagnosis: Term Pregnancy Delivered                                              Post partum procedures: NA Augmentation: N/A Complications: None  Hospital course: Onset of Labor With Vaginal Delivery      27 y.o. yo [id number] at [redacted]w[redacted]d was admitted in Active Labor on 09/16/2022. Labor course was complicated by nothing  Membrane Rupture Time/Date: 2:56 AM ,09/17/2022   Delivery Method:Vaginal, Spontaneous  Episiotomy: None  Lacerations:  Periurethral  Patient had a postpartum course complicated by nothing.  She is ambulating, tolerating a regular diet, passing flatus, and urinating well. Patient is discharged home in stable condition on 09/19/22.  Newborn Data: Birth date:09/17/2022  Birth time:3:14 AM  Gender:Female  Living status:Living  Apgars:9 ,9  Weight:3080 g   Magnesium Sulfate received: No BMZ received: No Rhophylac:N/A  Physical exam  Vitals:   09/18/22 0500 09/18/22 1422 09/18/22 2147 09/19/22 0326  BP: 125/85 121/78 116/72 125/81  Pulse: 67 80 80 70  Resp: 16  16 17   Temp: 98 F (36.7 C) 98.3 F (36.8 C) 98.2 F (36.8 C) 97.7 F (36.5 C)  TempSrc: Oral Oral  Oral  SpO2: 100%  100% 100%  Weight:      Height:       General: alert, cooperative, and no distress Lochia: appropriate Uterine Fundus: firm Incision: N/A DVT Evaluation: No evidence of DVT seen on physical exam. Labs: Lab Results  Component Value Date   WBC 11.8 (H) 09/17/2022   HGB 9.7 (L) 09/17/2022    HCT 29.5 (L) 09/17/2022   MCV 79.9 (L) 09/17/2022   PLT 290 09/17/2022      Latest Ref Rng & Units 08/17/2015    6:47 AM  CMP  Glucose 65 - 99 mg/dL 84   BUN 6 - 20 mg/dL 5   Creatinine 0.44 - 1.00 mg/dL 0.86   Sodium 135 - 145 mmol/L 137   Potassium 3.5 - 5.1 mmol/L 4.1   Chloride 101 - 111 mmol/L 106   CO2 22 - 32 mmol/L 27   Calcium 8.9 - 10.3 mg/dL 8.3   Total Protein 6.5 - 8.1 g/dL 5.7   Total Bilirubin 0.3 - 1.2 mg/dL 0.4   Alkaline Phos 38 - 126 U/L 93   AST 15 - 41 U/L 25   ALT 14 - 54 U/L 11    Edinburgh Score:    09/19/2022    9:08 AM  Edinburgh Postnatal Depression Scale Screening Tool  I have been able to laugh and see the funny side of things. 0  I have looked forward with enjoyment to things. 0  I have blamed myself unnecessarily when things went wrong. 0  I have been anxious or worried for no good reason. 0  I have felt scared or panicky for no good reason. 0  Things have been getting on  top of me. 0  I have been so unhappy that I have had difficulty sleeping. 0  I have felt sad or miserable. 0  I have been so unhappy that I have been crying. 0  The thought of harming myself has occurred to me. 0  Edinburgh Postnatal Depression Scale Total 0      After visit meds:      Discharge home in stable condition Infant Feeding: Breast Infant Disposition:home with mother Discharge instruction: per After Visit Summary and Postpartum booklet. Activity: Advance as tolerated. Pelvic rest for 6 weeks.  Diet: routine diet Anticipated Birth Control: Unsure Postpartum Appointment:4 weeks Future Appointments: Future Appointments  Date Time Provider [place]  10/08/2022  7:00 AM [facility] [place] None   Follow up Visit:  Follow-up Information     Ob/Gyn, [facility] Follow up in 4 week(s).   Why: For a postpartum evaluation Contact information: [address] [place] [address] [phone]                      09/19/2022 Vanessa Kick, MD

## 2022-09-19 NOTE — Lactation Note (Addendum)
This note was copied from a baby's chart. Lactation Consultation Note  Patient Name: Michelle Calhoun [id number] Date: 09/19/2022 Age:27 hours Reason for consult: Follow-up assessment;Infant weight loss;Early term 37-38.6wks  P2, 10.23% weight loss.  Mother willing to stay to work on feedings if needed.  Set her up with DEBP with 21 flanges.  Recommend she post pump 15 min q 3hours and give volume back in addition to breastfeeding.  Call for help as needed.   Maternal Data Has patient been taught Hand Expression?: Yes  Feeding Mother's Current Feeding Choice: Breast Milk  LATCH Score Latch: Repeated attempts needed to sustain latch, nipple held in mouth throughout feeding, stimulation needed to elicit sucking reflex.  Audible Swallowing: A few with stimulation  Type of Nipple: Everted at rest and after stimulation  Comfort (Breast/Nipple): Soft / non-tender  Hold (Positioning): Assistance needed to correctly position infant at breast and maintain latch.  LATCH Score: 7   Lactation Tools Discussed/Used Tools: Pump;Flanges Flange Size: 21 Breast pump type: Double-Electric Breast Pump;Manual Pump Education: Setup, frequency, and cleaning;Milk Storage Reason for Pumping: stimulation and supplementation Pumping frequency:  (q 3 hours)  Interventions Interventions: Breast feeding basics reviewed;DEBP;Education;Hand pump  Discharge Pump: Personal;DEBP  Consult Status Consult Status: Follow-up Date: 09/20/22 Follow-up type: In-patient    Vivianne Master [facility] 09/19/2022, 11:50 AM

## 2022-09-20 ENCOUNTER — Ambulatory Visit (HOSPITAL_COMMUNITY): Payer: Self-pay

## 2022-09-20 NOTE — Lactation Note (Signed)
This note was copied from a baby's chart. Lactation Consultation Note  Patient Name: Boy Cloteal Dunnell [id number] Date: 09/20/2022 Age:27 hours Reason for consult: Follow-up assessment;Engorgement  P2, Baby was breastfeeding yesterday and due to weight loss mother started pumping.  She pumped 10-20 ml and gave to baby.  At some point, mother started supplementing with formula doubting her ability to make breastmilk for her baby.  She has pumped but not consistently and continues to give formula and most recently only gave formula with full breasts.  Mother's breasts are now full and uncomfortable.  She has been applying ice packs.  Provided education to continue to breastfeed and pump after every other feeding and give volume back to baby.. Provided OP support information. Maternal Data Does the patient have breastfeeding experience prior to this delivery?: Yes  Feeding Mother's Current Feeding Choice: Breast Milk and Formula Nipple Type: Extra Slow Flow  Lactation Tools Discussed/Used Tools: Pump  Interventions Interventions: Education  Discharge Discharge Education: Engorgement and breast care;Warning signs for feeding baby Pump: Personal;DEBP  Consult Status Consult Status: Follow-up Date: 09/21/22 Follow-up type: In-patient    Vivianne Master [facility] 09/20/2022, 10:18 AM

## 2022-09-28 ENCOUNTER — Telehealth (HOSPITAL_COMMUNITY): Payer: Self-pay | Admitting: *Deleted

## 2022-09-28 NOTE — Telephone Encounter (Signed)
Left phone voicemail message.  Odis Hollingshead, RN 09-28-2022 at 3:46pm

## 2022-10-01 DIAGNOSIS — Z419 Encounter for procedure for purposes other than remedying health state, unspecified: Secondary | ICD-10-CM | POA: Diagnosis not present

## 2022-10-08 ENCOUNTER — Encounter (HOSPITAL_COMMUNITY): Payer: BC Managed Care – PPO

## 2022-10-08 ENCOUNTER — Inpatient Hospital Stay (HOSPITAL_COMMUNITY): Payer: Medicaid Other

## 2022-10-08 ENCOUNTER — Inpatient Hospital Stay (HOSPITAL_COMMUNITY)
Admission: RE | Admit: 2022-10-08 | Payer: Medicaid Other | Source: Home / Self Care | Admitting: Obstetrics and Gynecology

## 2022-10-31 DIAGNOSIS — Z419 Encounter for procedure for purposes other than remedying health state, unspecified: Secondary | ICD-10-CM | POA: Diagnosis not present

## 2022-10-31 DIAGNOSIS — Z30011 Encounter for initial prescription of contraceptive pills: Secondary | ICD-10-CM | POA: Diagnosis not present

## 2022-10-31 DIAGNOSIS — Z01419 Encounter for gynecological examination (general) (routine) without abnormal findings: Secondary | ICD-10-CM | POA: Diagnosis not present

## 2022-10-31 DIAGNOSIS — Z3202 Encounter for pregnancy test, result negative: Secondary | ICD-10-CM | POA: Diagnosis not present

## 2022-12-01 DIAGNOSIS — Z419 Encounter for procedure for purposes other than remedying health state, unspecified: Secondary | ICD-10-CM | POA: Diagnosis not present

## 2022-12-31 DIAGNOSIS — Z419 Encounter for procedure for purposes other than remedying health state, unspecified: Secondary | ICD-10-CM | POA: Diagnosis not present

## 2023-03-03 DIAGNOSIS — Z419 Encounter for procedure for purposes other than remedying health state, unspecified: Secondary | ICD-10-CM | POA: Diagnosis not present

## 2023-04-02 DIAGNOSIS — Z419 Encounter for procedure for purposes other than remedying health state, unspecified: Secondary | ICD-10-CM | POA: Diagnosis not present

## 2023-05-03 DIAGNOSIS — Z419 Encounter for procedure for purposes other than remedying health state, unspecified: Secondary | ICD-10-CM | POA: Diagnosis not present

## 2023-06-02 DIAGNOSIS — Z419 Encounter for procedure for purposes other than remedying health state, unspecified: Secondary | ICD-10-CM | POA: Diagnosis not present

## 2023-07-03 DIAGNOSIS — Z419 Encounter for procedure for purposes other than remedying health state, unspecified: Secondary | ICD-10-CM | POA: Diagnosis not present

## 2023-08-03 DIAGNOSIS — Z419 Encounter for procedure for purposes other than remedying health state, unspecified: Secondary | ICD-10-CM | POA: Diagnosis not present

## 2023-08-31 DIAGNOSIS — Z419 Encounter for procedure for purposes other than remedying health state, unspecified: Secondary | ICD-10-CM | POA: Diagnosis not present

## 2023-10-12 DIAGNOSIS — Z419 Encounter for procedure for purposes other than remedying health state, unspecified: Secondary | ICD-10-CM | POA: Diagnosis not present

## 2023-11-11 DIAGNOSIS — Z419 Encounter for procedure for purposes other than remedying health state, unspecified: Secondary | ICD-10-CM | POA: Diagnosis not present

## 2023-12-09 ENCOUNTER — Other Ambulatory Visit: Payer: Self-pay

## 2023-12-09 ENCOUNTER — Encounter (HOSPITAL_BASED_OUTPATIENT_CLINIC_OR_DEPARTMENT_OTHER): Payer: Self-pay | Admitting: Emergency Medicine

## 2023-12-09 ENCOUNTER — Emergency Department (HOSPITAL_BASED_OUTPATIENT_CLINIC_OR_DEPARTMENT_OTHER)
Admission: EM | Admit: 2023-12-09 | Discharge: 2023-12-09 | Disposition: A | Discharge status: Home / Self Care | Attending: Emergency Medicine | Admitting: Emergency Medicine

## 2023-12-09 DIAGNOSIS — R110 Nausea: Secondary | ICD-10-CM | POA: Diagnosis not present

## 2023-12-09 DIAGNOSIS — O219 Vomiting of pregnancy, unspecified: Secondary | ICD-10-CM | POA: Diagnosis not present

## 2023-12-09 DIAGNOSIS — Z3490 Encounter for supervision of normal pregnancy, unspecified, unspecified trimester: Secondary | ICD-10-CM

## 2023-12-09 DIAGNOSIS — O26891 Other specified pregnancy related conditions, first trimester: Secondary | ICD-10-CM | POA: Diagnosis not present

## 2023-12-09 DIAGNOSIS — O99891 Other specified diseases and conditions complicating pregnancy: Secondary | ICD-10-CM | POA: Diagnosis not present

## 2023-12-09 DIAGNOSIS — R300 Dysuria: Secondary | ICD-10-CM | POA: Insufficient documentation

## 2023-12-09 DIAGNOSIS — R1030 Lower abdominal pain, unspecified: Secondary | ICD-10-CM | POA: Diagnosis not present

## 2023-12-09 LAB — COMPREHENSIVE METABOLIC PANEL WITH GFR
ALT: 6 U/L (ref 0–44)
AST: 13 U/L — ABNORMAL LOW (ref 15–41)
Albumin: 4.5 g/dL (ref 3.5–5.0)
Alkaline Phosphatase: 50 U/L (ref 38–126)
Anion gap: 11 (ref 5–15)
BUN: 5 mg/dL — ABNORMAL LOW (ref 6–20)
CO2: 23 mmol/L (ref 22–32)
Calcium: 9.4 mg/dL (ref 8.9–10.3)
Chloride: 102 mmol/L (ref 98–111)
Creatinine, Ser: 0.57 mg/dL (ref 0.44–1.00)
GFR, Estimated: >60 mL/min (ref >60)
Glucose, Bld: 95 mg/dL (ref 70–99)
Potassium: 4 mmol/L (ref 3.5–5.1)
Sodium: 136 mmol/L (ref 135–145)
Total Bilirubin: 0.3 mg/dL (ref 0.0–1.2)
Total Protein: 7.4 g/dL (ref 6.5–8.1)

## 2023-12-09 LAB — URINALYSIS, MICROSCOPIC (REFLEX): RBC / HPF: NONE SEEN RBC/hpf (ref 0–5)

## 2023-12-09 LAB — URINALYSIS, ROUTINE W REFLEX MICROSCOPIC
Bilirubin Urine: NEGATIVE
Glucose, UA: NEGATIVE mg/dL
Hgb urine dipstick: NEGATIVE
Ketones, ur: NEGATIVE mg/dL
Nitrite: NEGATIVE
Protein, ur: NEGATIVE mg/dL
Specific Gravity, Urine: 1.015 (ref 1.005–1.030)
pH: 7.5 (ref 5.0–8.0)

## 2023-12-09 LAB — CBC
HCT: 30.6 % — ABNORMAL LOW (ref 36.0–46.0)
Hemoglobin: 10.8 g/dL — ABNORMAL LOW (ref 12.0–15.0)
MCH: 29.2 pg (ref 26.0–34.0)
MCHC: 35.3 g/dL (ref 30.0–36.0)
MCV: 82.7 fL (ref 80.0–100.0)
Platelets: 262 10*3/uL (ref 150–400)
RBC: 3.7 MIL/uL — ABNORMAL LOW (ref 3.87–5.11)
RDW: 12.7 % (ref 11.5–15.5)
WBC: 7.7 10*3/uL (ref 4.0–10.5)
nRBC: 0 % (ref 0.0–0.2)

## 2023-12-09 LAB — HCG, QUANTITATIVE, PREGNANCY: hCG, Beta Chain, Quant, S: 26579 m[IU]/mL — ABNORMAL HIGH (ref <5)

## 2023-12-09 LAB — LIPASE, BLOOD: Lipase: 31 U/L (ref 11–51)

## 2023-12-09 LAB — PREGNANCY, URINE: Preg Test, Ur: POSITIVE — AB

## 2023-12-09 NOTE — ED Triage Notes (Signed)
 Gl abd pain , nausea . No emesis , x 4 days , Hx sickle cell , also reports lethargy and no energy . Possible dehydration she said .

## 2023-12-09 NOTE — ED Provider Notes (Signed)
 [facility] Provider Note   CSN: [id number] Arrival date & time: 12/09/23  1658     History  Chief Complaint  Patient presents with   Abdominal Pain    Michelle Calhoun is a 28 y.o. female.  28 year old female presenting with decreased energy.  Patient is wondering if she may be pregnant, as this is how she felt with prior pregnancy.  Reports some mild lower abdominal cramping on occasion but not presently, denies vaginal bleeding.  She has experienced mild nausea but no vomiting.  No changes in her bowel habits.  She does report mild dysuria, no hematuria.  No history of abdominal surgeries, patient had spontaneous vaginal delivery in March of last year without complications. Unsure of LMP.   Abdominal Pain      Home Medications Prior to Admission medications   Medication Sig Start Date End Date Taking? Authorizing Provider  Prenatal Vit-Fe Fumarate-FA (PRENATAL MULTIVITAMIN) TABS tablet Take 1 tablet by mouth daily at 12 noon.    [provider]  amLODipine  (NORVASC ) 5 MG tablet Take 1 tablet (5 mg total) by mouth daily. 08/18/15 09/27/19  Wendelyn Halter, MD  diphenhydrAMINE  (BENADRYL ) 25 MG tablet Take 25 mg by mouth every 6 (six) hours as needed for allergies or sleep.  09/27/19  [provider]  triamterene -hydrochlorothiazide  (DYAZIDE) 37.5-25 MG capsule Take 1 each (1 capsule total) by mouth daily. 08/18/15 09/27/19  Wendelyn Halter, MD      Allergies    Patient has no known allergies.    Review of Systems   Review of Systems  Gastrointestinal:  Positive for abdominal pain.    Physical Exam Updated Vital Signs BP 112/73 (BP Location: Left Arm)   Pulse 82   Temp 98 F (36.7 C) (Oral)   Resp 18   Wt 59 kg   LMP 11/25/2023 (Exact Date)   SpO2 100%   BMI 22.31 kg/m  Physical Exam Vitals and nursing note reviewed.  HENT:     Head: Normocephalic.  Cardiovascular:     Rate and Rhythm: Normal rate  and regular rhythm.  Pulmonary:     Effort: Pulmonary effort is normal.     Breath sounds: Normal breath sounds.  Abdominal:     General: Bowel sounds are normal.     Palpations: Abdomen is soft.     Tenderness: There is no abdominal tenderness. There is no guarding.  Skin:    General: Skin is warm and dry.  Neurological:     General: No focal deficit present.     Mental Status: She is alert.     ED Results / Procedures / Treatments   Labs (all labs ordered are listed, but only abnormal results are displayed) Labs Reviewed  COMPREHENSIVE METABOLIC PANEL WITH GFR - Abnormal; Notable for the following components:      Result Value   BUN 5 (*)    AST 13 (*)    All other components within normal limits  CBC - Abnormal; Notable for the following components:   RBC 3.70 (*)    Hemoglobin 10.8 (*)    HCT 30.6 (*)    All other components within normal limits  URINALYSIS, ROUTINE W REFLEX MICROSCOPIC - Abnormal; Notable for the following components:   Leukocytes,Ua SMALL (*)    All other components within normal limits  PREGNANCY, URINE - Abnormal; Notable for the following components:   Preg Test, Ur POSITIVE (*)    All other  components within normal limits  URINALYSIS, MICROSCOPIC (REFLEX) - Abnormal; Notable for the following components:   Bacteria, UA FEW (*)    All other components within normal limits  HCG, QUANTITATIVE, PREGNANCY - Abnormal; Notable for the following components:   hCG, Beta Chain, Quant, S 26,579 (*)    All other components within normal limits  URINE CULTURE  LIPASE, BLOOD    EKG None  Radiology No results found.  Procedures Procedures    Medications Ordered in ED Medications - No data to display  ED Course/ Medical Decision Making/ A&P                                 Medical Decision Making This patient presents to the ED for concern of decreased energy, this involves an extensive number of treatment options, and is a complaint that  carries with it a high risk of complications and morbidity.  The differential diagnosis includes pregnancy, electrolyte derangement, dehydration, sickle cell pain crisis, other infectious etiology.    Co morbidities that complicate the patient evaluation  Sickle cell   Additional history obtained:  Additional history obtained from record review   Lab Tests:  I Ordered, and personally interpreted labs.  The pertinent results include: CBC notable for hemoglobin of 10.8, however this is improved from most recent lab results.  CMP notable for mildly decreased AST.  Urinalysis notable for small leukocytes with few bacteria on urinalysis, low suspicion for UTI at this time but will send for culture given that patient is pregnant.  Lipase within normal limits.  Urine pregnancy test positive. Quantitative hcg [id number].   Cardiac Monitoring: / EKG:  The patient was maintained on a cardiac monitor.  I personally viewed and interpreted the cardiac monitored which showed an underlying rhythm of: NSR    Problem List / ED Course / Critical interventions / Medication management I have reviewed the patients home medicines and have made adjustments as needed    Test / Admission - Considered:  Physical exam is unremarkable, patient was found to be pregnant, see above for quantitative hCG.  Patient's LMP is unknown.  I have low suspicion for ectopic pregnancy at this time, patient is not demonstrating any abdominal pain and her physical exam is benign, abdomen is soft and nontender to palpation, further imaging is not warranted at this time.  Pelvic exam was not warranted today, patient denies vaginal symptoms, no vaginal bleeding or pain.  I have provided patient with the contact information for the MAU, I encouraged urgent follow-up for in regard to her pregnancy and any pregnancy related needs, she voiced understanding and is appreciative of this.  Advised patient to discontinue use of any NSAIDs as  these are contraindicated in pregnancy.  Patient is agreeable with this plan and is appropriate for discharge at this time.  Return precautions discussed.    Amount and/or Complexity of Data Reviewed Labs: ordered.           Final Clinical Impression(s) / ED Diagnoses Final diagnoses:  Pregnancy, unspecified gestational age    Rx / DC Orders ED Discharge Orders     None         Kendrick Pax, [place] 12/09/23 1937    Sallyanne Creamer, DO 12/12/23 2230

## 2023-12-09 NOTE — Discharge Instructions (Signed)
 Return to the emergency department if you experience abdominal pain or vaginal bleeding.  I have provided you with the contact information for the: Maternity assessment unit, please follow-up with them for your pregnancy care.  Follow-up with your primary care provider.

## 2023-12-09 NOTE — ED Notes (Signed)
 Reviewed D/C information with the patient, pt verbalized understanding. No additional concerns at this time.

## 2023-12-10 LAB — URINE CULTURE: Culture: <10000 — AB

## 2023-12-12 DIAGNOSIS — Z419 Encounter for procedure for purposes other than remedying health state, unspecified: Secondary | ICD-10-CM | POA: Diagnosis not present

## 2023-12-31 ENCOUNTER — Ambulatory Visit (INDEPENDENT_AMBULATORY_CARE_PROVIDER_SITE_OTHER)

## 2023-12-31 ENCOUNTER — Other Ambulatory Visit: Payer: Self-pay

## 2023-12-31 VITALS — Wt 127.0 lb

## 2023-12-31 DIAGNOSIS — Z3480 Encounter for supervision of other normal pregnancy, unspecified trimester: Secondary | ICD-10-CM | POA: Diagnosis not present

## 2023-12-31 NOTE — Progress Notes (Signed)
 New OB Intake  I explained I am completing New OB Intake today. We discussed EDD of 07/27/2024, by Last Menstrual Period. Pt is G3P2002. I reviewed her allergies, medications and Medical/Surgical/OB history.    Patient Active Problem List   Diagnosis Date Noted   Supervision of other normal pregnancy, antepartum 12/31/2023   SVD (spontaneous vaginal delivery) 09/17/2022   Encounter for elective induction of labor 09/16/2022   Post term pregnancy over 40 weeks 08/14/2015   Abnormal maternal serum screening test 03/28/2015   Chlamydia infection affecting pregnancy 03/28/2015    Concerns addressed today  Patient informed that the ultrasound is considered a limited obstetric ultrasound and is not intended to be a complete ultrasound exam.  Patient also informed that the ultrasound is not being completed with the intent of assessing for fetal or placental anomalies or any pelvic abnormalities. Explained that the purpose of today's ultrasound is to assess for viability.  Patient acknowledges the purpose of the exam and the limitations of the study.     Delivery Plans Plans to deliver at [facility]. Discussed the nature of our practice with multiple providers including residents and students. Due to the size of the practice, the delivering provider may not be the same as those providing prenatal care.   MyChart/Babyscripts MyChart access verified. I explained pt will have some visits in office and some virtually. Babyscripts app discussed and ordered.   Blood Pressure Cuff Blood pressure cuff offered, but patient has one at home.Discussed to be used for virtual visits and or if needed BP checks weekly.  Anatomy US  Explained first scheduled US  will be around 19 weeks.   Last Pap No results found for: DIAGPAP  First visit review I reviewed new OB appt with patient. Explained pt will be seen by Nidia Daring NP at first visit. Discussed [place] genetic screening with patient . Routine prenatal  labs ordered.    Erminio DELENA Rumps, [place] 12/31/2023  9:29 AM

## 2024-01-01 ENCOUNTER — Ambulatory Visit: Payer: Self-pay | Admitting: Obstetrics and Gynecology

## 2024-01-01 LAB — CBC/D/PLT+RPR+RH+ABO+RUBIGG...
Antibody Screen: NEGATIVE
Basophils Absolute: 0 10*3/uL (ref 0.0–0.2)
Basos: 1 %
EOS (ABSOLUTE): 0.1 10*3/uL (ref 0.0–0.4)
Eos: 2 %
HCV Ab: NONREACTIVE
HIV Screen 4th Generation wRfx: NONREACTIVE
Hematocrit: 33.8 % — ABNORMAL LOW (ref 34.0–46.6)
Hemoglobin: 11.5 g/dL (ref 11.1–15.9)
Hepatitis B Surface Ag: NEGATIVE
Immature Grans (Abs): 0 10*3/uL (ref 0.0–0.1)
Immature Granulocytes: 0 %
Lymphocytes Absolute: 1.2 10*3/uL (ref 0.7–3.1)
Lymphs: 18 %
MCH: 29.6 pg (ref 26.6–33.0)
MCHC: 34 g/dL (ref 31.5–35.7)
MCV: 87 fL (ref 79–97)
Monocytes Absolute: 0.3 10*3/uL (ref 0.1–0.9)
Monocytes: 5 %
Neutrophils Absolute: 4.9 10*3/uL (ref 1.4–7.0)
Neutrophils: 74 %
Platelets: 280 10*3/uL (ref 150–450)
RBC: 3.89 x10E6/uL (ref 3.77–5.28)
RDW: 13.3 % (ref 11.7–15.4)
RPR Ser Ql: NONREACTIVE
Rh Factor: POSITIVE
Rubella Antibodies, IGG: 2 {index} (ref >0.99)
WBC: 6.6 10*3/uL (ref 3.4–10.8)

## 2024-01-01 LAB — HCV INTERPRETATION

## 2024-01-02 LAB — URINE CULTURE, OB REFLEX

## 2024-01-02 LAB — CULTURE, OB URINE

## 2024-01-06 ENCOUNTER — Other Ambulatory Visit

## 2024-01-07 ENCOUNTER — Other Ambulatory Visit

## 2024-01-11 DIAGNOSIS — Z419 Encounter for procedure for purposes other than remedying health state, unspecified: Secondary | ICD-10-CM | POA: Diagnosis not present

## 2024-01-14 ENCOUNTER — Encounter: Admitting: Obstetrics and Gynecology

## 2024-01-15 ENCOUNTER — Other Ambulatory Visit (HOSPITAL_COMMUNITY)
Admission: RE | Admit: 2024-01-15 | Discharge: 2024-01-15 | Disposition: A | Discharge status: Home / Self Care | Source: Ambulatory Visit | Attending: Family Medicine | Admitting: Family Medicine

## 2024-01-15 ENCOUNTER — Ambulatory Visit: Admitting: Family Medicine

## 2024-01-15 VITALS — BP 111/66 | HR 89 | Wt 126.1 lb

## 2024-01-15 DIAGNOSIS — F419 Anxiety disorder, unspecified: Secondary | ICD-10-CM | POA: Diagnosis not present

## 2024-01-15 DIAGNOSIS — F32A Depression, unspecified: Secondary | ICD-10-CM | POA: Diagnosis not present

## 2024-01-15 DIAGNOSIS — Z3481 Encounter for supervision of other normal pregnancy, first trimester: Secondary | ICD-10-CM | POA: Diagnosis not present

## 2024-01-15 DIAGNOSIS — Z3480 Encounter for supervision of other normal pregnancy, unspecified trimester: Secondary | ICD-10-CM

## 2024-01-15 DIAGNOSIS — O99341 Other mental disorders complicating pregnancy, first trimester: Secondary | ICD-10-CM | POA: Insufficient documentation

## 2024-01-15 DIAGNOSIS — Z3A12 12 weeks gestation of pregnancy: Secondary | ICD-10-CM | POA: Diagnosis not present

## 2024-01-15 DIAGNOSIS — Z3182 Encounter for Rh incompatibility status: Secondary | ICD-10-CM | POA: Diagnosis not present

## 2024-01-15 MED ORDER — SERTRALINE HCL 50 MG PO TABS: 50.0000 mg | ORAL_TABLET | Freq: Every day | ORAL | 6 refills | Status: DC

## 2024-01-15 NOTE — Progress Notes (Signed)
 Subjective:  Michelle Calhoun is a [id number] [redacted]w[redacted]d being seen today for her first obstetrical visit.  Her obstetrical history is significant for two prior SVDs. Uncomplicated pregnancies with no complications afterwards. Patient does intend to breast feed. Pregnancy history fully reviewed.  Patient reports depression - started a few months ago. Undergoing legal challenges with father of her first child. PHQ9 17. Positive GAD 7. Has long history of anxiety.  BP 111/66   Pulse 89   Wt 126 lb 1.9 oz (57.2 kg)   LMP 10/21/2023   BMI 21.65 kg/m   HISTORY: OB History  Gravida Para Term Preterm AB Living  3 2 2   2   SAB IAB Ectopic Multiple Live Births     0 2    # Outcome Date GA Lbr Len/2nd Weight Sex Type Anes PTL Lv  3 Current           2 Term 09/17/22 [redacted]w[redacted]d 04:41 / 00:18 6 lb 12.6 oz (3.08 kg) M Vag-Spont EPI  LIV  1 Term 08/15/15 [redacted]w[redacted]d 16:40 / 01:59 7 lb 0.9 oz (3.2 kg) F Vag-Spont EPI  LIV    Past Medical History:  Diagnosis Date   Asthma    Hx of chlamydia infection    Hypertension    Vaginal Pap smear, abnormal     Past Surgical History:  Procedure Laterality Date   NO PAST SURGERIES      Family History  Problem Relation Age of Onset   Healthy Mother    Healthy Father    Asthma Brother    Hypertension Maternal Grandmother    Alcohol abuse Neg Hx    Arthritis Neg Hx    Birth defects Neg Hx    Cancer Neg Hx    COPD Neg Hx    Depression Neg Hx    Diabetes Neg Hx    Drug abuse Neg Hx    Early death Neg Hx    Hearing loss Neg Hx    Hyperlipidemia Neg Hx    Heart disease Neg Hx    Kidney disease Neg Hx    Learning disabilities Neg Hx    Mental illness Neg Hx    Mental retardation Neg Hx    Miscarriages / Stillbirths Neg Hx    Stroke Neg Hx    Vision loss Neg Hx    Varicose Veins Neg Hx      Exam  BP 111/66   Pulse 89   Wt 126 lb 1.9 oz (57.2 kg)   LMP 10/21/2023   BMI 21.65 kg/m   Chaperone present during exam  CONSTITUTIONAL:  Well-developed, well-nourished female in no acute distress.  HENT:  Normocephalic, atraumatic, External right and left ear normal. Oropharynx is clear and moist EYES: Conjunctivae and EOM are normal. Pupils are equal, round, and reactive to light. No scleral icterus.  NECK: Normal range of motion, supple, no masses.  Normal thyroid.  CARDIOVASCULAR: Normal heart rate noted, regular rhythm RESPIRATORY: Clear to auscultation bilaterally. Effort and breath sounds normal, no problems with respiration noted. BREASTS: deferred ABDOMEN: Soft, normal bowel sounds, no distention noted.  No tenderness, rebound or guarding.  PELVIC: Normal appearing external genitalia; normal appearing vaginal mucosa and cervix. No abnormal discharge noted. Normal uterine size, no other palpable masses, no uterine or adnexal tenderness. MUSCULOSKELETAL: Normal range of motion. No tenderness.  No cyanosis, clubbing, or edema.  2+ distal pulses. SKIN: Skin is warm and dry. No rash noted. Not diaphoretic. No erythema. No pallor. NEUROLOGIC: Alert  and oriented to person, place, and time. Normal reflexes, muscle tone coordination. No cranial nerve deficit noted. PSYCHIATRIC: Normal mood and affect. Normal behavior. Normal judgment and thought content.    Assessment:    Pregnancy: [id number] Patient Active Problem List   Diagnosis Date Noted   Supervision of other normal pregnancy, antepartum 12/31/2023   SVD (spontaneous vaginal delivery) 09/17/2022   Encounter for elective induction of labor 09/16/2022   Post term pregnancy over 40 weeks 08/14/2015   Abnormal maternal serum screening test 03/28/2015   Chlamydia infection affecting pregnancy 03/28/2015      Plan:   1. Supervision of other normal pregnancy, antepartum (Primary) LR for preeclampsia. - sertraline  (ZOLOFT ) 50 MG tablet; Take 1 tablet (50 mg total) by mouth daily.  Dispense: 30 tablet; Refill: 6 - Ambulatory referral to Integrated Behavioral [facility] -  PANORAMA PRENATAL TEST - [facility] Basic Panel - Hemoglobin A1c  2. Anxiety and depression - sertraline  (ZOLOFT ) 50 MG tablet; Take 1 tablet (50 mg total) by mouth daily.  Dispense: 30 tablet; Refill: 6 - Ambulatory referral to [facility]  3. [redacted] weeks gestation of pregnancy  - PANORAMA PRENATAL TEST - [facility] Basic Panel - Hemoglobin A1c    Initial labs obtained Continue prenatal vitamins Reviewed n/v relief measures and warning s/s to report Reviewed recommended weight gain based on pre-gravid BMI Encouraged well-balanced diet Genetic & carrier screening discussed: requests Panorama and Horizon ,  Ultrasound discussed; fetal survey: requested CCNC completed> form faxed if has or is planning to apply for medicaid The nature of [facility] [employer] with multiple MDs and other Advanced Practice Providers was explained to patient; also emphasized that fellows, residents, and students are part of our team.    Problem list reviewed and updated. 75% of 30 min visit spent on counseling and coordination of care.     Kamarii Carton J Vontrell Pullman 01/15/2024

## 2024-01-15 NOTE — Addendum Note (Signed)
 Addended by: JOMARIE SKIPPER D on: 01/15/2024 10:51 AM   Modules accepted: Orders

## 2024-01-15 NOTE — Addendum Note (Signed)
 Addended by: TANDA YORK CROME on: 01/15/2024 09:47 AM   Modules accepted: Orders

## 2024-01-16 ENCOUNTER — Other Ambulatory Visit

## 2024-01-16 LAB — HEMOGLOBIN A1C
Est. average glucose Bld gHb Est-mCnc: 100 mg/dL
Hgb A1c MFr Bld: 5.1 % (ref 4.8–5.6)

## 2024-01-20 ENCOUNTER — Ambulatory Visit: Payer: Self-pay | Admitting: Family Medicine

## 2024-01-20 DIAGNOSIS — Z3480 Encounter for supervision of other normal pregnancy, unspecified trimester: Secondary | ICD-10-CM

## 2024-01-20 DIAGNOSIS — D573 Sickle-cell trait: Secondary | ICD-10-CM

## 2024-01-20 LAB — PANORAMA PRENATAL TEST FULL PANEL:PANORAMA TEST PLUS 5 ADDITIONAL MICRODELETIONS: FETAL FRACTION: 9.7

## 2024-01-21 ENCOUNTER — Encounter: Payer: Self-pay | Admitting: Family Medicine

## 2024-01-22 ENCOUNTER — Other Ambulatory Visit: Payer: Self-pay

## 2024-01-22 DIAGNOSIS — Z3480 Encounter for supervision of other normal pregnancy, unspecified trimester: Secondary | ICD-10-CM

## 2024-01-22 LAB — CYTOLOGY - PAP
Comment: NEGATIVE
Diagnosis: NEGATIVE
High risk HPV: NEGATIVE

## 2024-01-22 MED ORDER — PROMETHAZINE HCL 25 MG PO TABS: 25.0000 mg | ORAL_TABLET | Freq: Four times a day (QID) | ORAL | 0 refills | Status: DC | PRN

## 2024-01-23 LAB — HORIZON CUSTOM: REPORT SUMMARY: POSITIVE — AB

## 2024-01-24 ENCOUNTER — Other Ambulatory Visit: Payer: Self-pay

## 2024-01-24 DIAGNOSIS — D573 Sickle-cell trait: Secondary | ICD-10-CM | POA: Insufficient documentation

## 2024-01-24 DIAGNOSIS — R772 Abnormality of alphafetoprotein: Secondary | ICD-10-CM

## 2024-01-24 NOTE — Telephone Encounter (Signed)
 Patient aware of AFP results.  Genetic referral placed.  Michelle Calhoun Rumps

## 2024-01-24 NOTE — Telephone Encounter (Signed)
-----   Message from Glenys GORMAN Birk sent at 01/24/2024  9:34 AM EDT ----- Carrier for sickle cell. Advise partner testing and genetic counseling. ----- Message ----- From: Rebecka Memos Lab Results In Sent: 01/16/2024   5:36 AM EDT To: Jacob J Stinson, DO

## 2024-02-05 ENCOUNTER — Ambulatory Visit: Attending: Obstetrics and Gynecology

## 2024-02-05 ENCOUNTER — Encounter: Payer: Self-pay | Admitting: Obstetrics and Gynecology

## 2024-02-11 DIAGNOSIS — Z419 Encounter for procedure for purposes other than remedying health state, unspecified: Secondary | ICD-10-CM | POA: Diagnosis not present

## 2024-02-13 ENCOUNTER — Encounter: Admitting: Family Medicine

## 2024-02-20 ENCOUNTER — Other Ambulatory Visit (HOSPITAL_COMMUNITY)
Admission: RE | Admit: 2024-02-20 | Discharge: 2024-02-20 | Disposition: A | Discharge status: Home / Self Care | Source: Ambulatory Visit | Attending: Family Medicine | Admitting: Family Medicine

## 2024-02-20 ENCOUNTER — Ambulatory Visit (INDEPENDENT_AMBULATORY_CARE_PROVIDER_SITE_OTHER): Admitting: Family Medicine

## 2024-02-20 VITALS — BP 115/75 | HR 91 | Wt 128.0 lb

## 2024-02-20 DIAGNOSIS — F419 Anxiety disorder, unspecified: Secondary | ICD-10-CM

## 2024-02-20 DIAGNOSIS — F32A Depression, unspecified: Secondary | ICD-10-CM

## 2024-02-20 DIAGNOSIS — Z3A17 17 weeks gestation of pregnancy: Secondary | ICD-10-CM | POA: Diagnosis not present

## 2024-02-20 DIAGNOSIS — N898 Other specified noninflammatory disorders of vagina: Secondary | ICD-10-CM | POA: Diagnosis not present

## 2024-02-20 DIAGNOSIS — Z3480 Encounter for supervision of other normal pregnancy, unspecified trimester: Secondary | ICD-10-CM | POA: Diagnosis not present

## 2024-02-20 MED ORDER — SERTRALINE HCL 50 MG PO TABS: 25.0000 mg | ORAL_TABLET | Freq: Every day | ORAL | 6 refills | Status: DC

## 2024-02-20 NOTE — Progress Notes (Signed)
   PRENATAL VISIT NOTE  Subjective:  Michelle Calhoun is a 28 y.o. G3P2002 at [redacted]w[redacted]d being seen today for ongoing prenatal care.  She is currently monitored for the following issues for this low-risk pregnancy and has Supervision of other normal pregnancy, antepartum and Sickle cell trait (HCC) on their problem list.  Patient reports no complaints and had some nausea and vomiting with starting the zoloft . Decreased it to 50mg  every other day and is doing well on it.  Contractions: Not present. Vag. Bleeding: None.  Movement: Present. Denies leaking of fluid.   The following portions of the patient's history were reviewed and updated as appropriate: allergies, current medications, past family history, past medical history, past social history, past surgical history and problem list.   Objective:    Vitals:   02/20/24 0940  BP: 115/75  Pulse: 91  Weight: 128 lb 0.6 oz (58.1 kg)    Fetal Status:  Fetal Heart Rate (bpm): 158   Movement: Present    General: Alert, oriented and cooperative. Patient is in no acute distress.  Skin: Skin is warm and dry. No rash noted.   Cardiovascular: Normal heart rate noted  Respiratory: Normal respiratory effort, no problems with respiration noted  Abdomen: Soft, gravid, appropriate for gestational age.  Pain/Pressure: Present     Pelvic: Cervical exam deferred        Extremities: Normal range of motion.  Edema: None  Mental Status: Normal mood and affect. Normal behavior. Normal judgment and thought content.   Assessment and Plan:  Pregnancy: G3P2002 at [redacted]w[redacted]d 1. [redacted] weeks gestation of pregnancy (Primary) - AFP, Serum, Open Spina Bifida  2. Vaginal discharge - Cervicovaginal ancillary only( [place])  3. Supervision of other normal pregnancy, antepartum FHT normal - sertraline  (ZOLOFT ) 50 MG tablet; Take 0.5 tablets (25 mg total) by mouth daily.  Dispense: 30 tablet; Refill: 6  4. Anxiety and depression Continue zoloft . Will try taking 25mg   daily. - sertraline  (ZOLOFT ) 50 MG tablet; Take 0.5 tablets (25 mg total) by mouth daily.  Dispense: 30 tablet; Refill: 6  Preterm labor symptoms and general obstetric precautions including but not limited to vaginal bleeding, contractions, leaking of fluid and fetal movement were reviewed in detail with the patient. Please refer to After Visit Summary for other counseling recommendations.   No follow-ups on file.  Future Appointments  Date Time Provider Department Center  03/05/2024  7:00 AM [facility] PROVIDER 1 [facility]  03/05/2024  7:30 AM [facility] US2 [facility]  03/11/2024  8:55 AM Esmerelda Finnigan J, DO [facility] None  04/16/2024  9:55 AM Christiana Gurevich J, DO [facility] None    [facility] [place] J Vianne Grieshop, DO

## 2024-02-21 ENCOUNTER — Ambulatory Visit: Payer: Self-pay | Admitting: Family Medicine

## 2024-02-21 DIAGNOSIS — Z3480 Encounter for supervision of other normal pregnancy, unspecified trimester: Secondary | ICD-10-CM

## 2024-02-21 LAB — CERVICOVAGINAL ANCILLARY ONLY
Bacterial Vaginitis (gardnerella): POSITIVE — AB
Candida Glabrata: NEGATIVE
Candida Vaginitis: POSITIVE — AB
Chlamydia: NEGATIVE
Comment: NEGATIVE
Comment: NEGATIVE
Comment: NEGATIVE
Comment: NEGATIVE
Comment: NEGATIVE
Comment: NORMAL
Neisseria Gonorrhea: NEGATIVE
Trichomonas: NEGATIVE

## 2024-02-21 MED ORDER — METRONIDAZOLE 500 MG PO TABS: 500.0000 mg | ORAL_TABLET | Freq: Two times a day (BID) | ORAL | 0 refills | Status: DC

## 2024-02-21 MED ORDER — TERCONAZOLE 0.4 % VA CREA
1.0000 | TOPICAL_CREAM | Freq: Every day | VAGINAL | 0 refills | Status: DC
Start: 2024-02-21 — End: 2024-04-07

## 2024-02-22 LAB — AFP, SERUM, OPEN SPINA BIFIDA
AFP MoM: 1.1
AFP Value: 51 ng/mL
Gest. Age on Collection Date: 17 wk
Maternal Age At EDD: 28.7 a
OSBR Risk 1 IN: 10000
Test Results:: NEGATIVE
Weight: 128 [lb_av]

## 2024-03-05 ENCOUNTER — Ambulatory Visit

## 2024-03-05 ENCOUNTER — Other Ambulatory Visit

## 2024-03-11 ENCOUNTER — Ambulatory Visit: Admitting: Family Medicine

## 2024-03-11 VITALS — BP 108/60 | HR 83 | Wt 131.0 lb

## 2024-03-11 DIAGNOSIS — D573 Sickle-cell trait: Secondary | ICD-10-CM | POA: Diagnosis not present

## 2024-03-11 DIAGNOSIS — Z3A20 20 weeks gestation of pregnancy: Secondary | ICD-10-CM

## 2024-03-11 DIAGNOSIS — Z3480 Encounter for supervision of other normal pregnancy, unspecified trimester: Secondary | ICD-10-CM

## 2024-03-11 NOTE — Progress Notes (Signed)
   PRENATAL VISIT NOTE  Subjective:  Michelle Calhoun is a 28 y.o. G3P2002 at [redacted]w[redacted]d being seen today for ongoing prenatal care.  She is currently monitored for the following issues for this low-risk pregnancy and has Supervision of other normal pregnancy, antepartum and Sickle cell trait (HCC) on their problem list.  Patient reports no complaints.  Contractions: Not present. Vag. Bleeding: None.  Movement: Present. Denies leaking of fluid.   The following portions of the patient's history were reviewed and updated as appropriate: allergies, current medications, past family history, past medical history, past social history, past surgical history and problem list.   Objective:    Vitals:   03/11/24 0902  BP: 108/60  Pulse: 83  Weight: 131 lb (59.4 kg)    Fetal Status:  Fetal Heart Rate (bpm): 160   Movement: Present    General: Alert, oriented and cooperative. Patient is in no acute distress.  Skin: Skin is warm and dry. No rash noted.   Cardiovascular: Normal heart rate noted  Respiratory: Normal respiratory effort, no problems with respiration noted  Abdomen: Soft, gravid, appropriate for gestational age.  Pain/Pressure: Absent     Pelvic: Cervical exam deferred        Extremities: Normal range of motion.  Edema: Trace  Mental Status: Normal mood and affect. Normal behavior. Normal judgment and thought content.   Assessment and Plan:  Pregnancy: G3P2002 at [redacted]w[redacted]d 1. [redacted] weeks gestation of pregnancy (Primary)  2. Supervision of other normal pregnancy, antepartum FHT normal  3. Sickle cell trait (HCC)   Preterm labor symptoms and general obstetric precautions including but not limited to vaginal bleeding, contractions, leaking of fluid and fetal movement were reviewed in detail with the patient. Please refer to After Visit Summary for other counseling recommendations.   No follow-ups on file.  Future Appointments  Date Time Provider Department Center  04/07/2024  9:15  AM [facility] PROVIDER 1 [facility]  04/07/2024  9:30 AM [facility] US2 [facility]  04/16/2024  9:55 AM Barbra Lang PARAS, DO [facility] None  05/11/2024  8:15 AM Dunn, Rollo DASEN, MD [facility] None    Amina Menchaca J Zian Delair, DO

## 2024-03-13 DIAGNOSIS — Z419 Encounter for procedure for purposes other than remedying health state, unspecified: Secondary | ICD-10-CM | POA: Diagnosis not present

## 2024-03-23 ENCOUNTER — Encounter: Admitting: Licensed Clinical Social Worker

## 2024-04-07 ENCOUNTER — Ambulatory Visit: Attending: Obstetrics and Gynecology | Admitting: Maternal & Fetal Medicine

## 2024-04-07 ENCOUNTER — Ambulatory Visit (HOSPITAL_BASED_OUTPATIENT_CLINIC_OR_DEPARTMENT_OTHER)

## 2024-04-07 ENCOUNTER — Other Ambulatory Visit: Payer: Self-pay | Admitting: Family Medicine

## 2024-04-07 VITALS — BP 109/63

## 2024-04-07 DIAGNOSIS — O358XX0 Maternal care for other (suspected) fetal abnormality and damage, not applicable or unspecified: Secondary | ICD-10-CM | POA: Diagnosis not present

## 2024-04-07 DIAGNOSIS — Z8759 Personal history of other complications of pregnancy, childbirth and the puerperium: Secondary | ICD-10-CM | POA: Insufficient documentation

## 2024-04-07 DIAGNOSIS — Z3A24 24 weeks gestation of pregnancy: Secondary | ICD-10-CM | POA: Insufficient documentation

## 2024-04-07 DIAGNOSIS — O09292 Supervision of pregnancy with other poor reproductive or obstetric history, second trimester: Secondary | ICD-10-CM

## 2024-04-07 DIAGNOSIS — J45909 Unspecified asthma, uncomplicated: Secondary | ICD-10-CM

## 2024-04-07 DIAGNOSIS — O35BXX0 Maternal care for other (suspected) fetal abnormality and damage, fetal cardiac anomalies, not applicable or unspecified: Secondary | ICD-10-CM | POA: Diagnosis not present

## 2024-04-07 DIAGNOSIS — Z363 Encounter for antenatal screening for malformations: Secondary | ICD-10-CM | POA: Insufficient documentation

## 2024-04-07 DIAGNOSIS — O99512 Diseases of the respiratory system complicating pregnancy, second trimester: Secondary | ICD-10-CM

## 2024-04-07 DIAGNOSIS — Z3480 Encounter for supervision of other normal pregnancy, unspecified trimester: Secondary | ICD-10-CM

## 2024-04-07 DIAGNOSIS — D573 Sickle-cell trait: Secondary | ICD-10-CM

## 2024-04-07 DIAGNOSIS — Z3A12 12 weeks gestation of pregnancy: Secondary | ICD-10-CM

## 2024-04-07 DIAGNOSIS — Z862 Personal history of diseases of the blood and blood-forming organs and certain disorders involving the immune mechanism: Secondary | ICD-10-CM | POA: Diagnosis not present

## 2024-04-07 DIAGNOSIS — O283 Abnormal ultrasonic finding on antenatal screening of mother: Secondary | ICD-10-CM | POA: Insufficient documentation

## 2024-04-07 DIAGNOSIS — O99012 Anemia complicating pregnancy, second trimester: Secondary | ICD-10-CM | POA: Diagnosis not present

## 2024-04-07 DIAGNOSIS — F419 Anxiety disorder, unspecified: Secondary | ICD-10-CM

## 2024-04-07 NOTE — Progress Notes (Signed)
 [facility] at [facility] [address], [address] Phone:  [phone]   Fax:  [phone]      In-Person Genetic Counseling Clinic Note:   I spoke with 28 y.o. Michelle Calhoun today to discuss her carrier screening results. She was referred by Fredirick Glenys RAMAN, MD.   Pregnancy History:    [id number]. EGA: [redacted]w[redacted]d by LMP. EDD: 07/27/2024. She has a healthy son and daughter. Denies major personal health concerns. Denies bleeding, infections, and fevers in this pregnancy. Denies using tobacco, alcohol, or street drugs in this pregnancy.   Family History:    A three-generation pedigree was created and scanned into Epic under the Media tab.  Patient reports FOB's mother and grandmother have episodes that involve facial paralysis and sometimes paralysis of one side of the body. She reports it is not a stroke or seizure. They may need to stay at the hospital for a day during severe episodes. We reviewed recurrence risk is difficult to estimate without additional information; however, FOB is encouraged to inform his PCP so they can follow up as needed. The patient will reach out to us  if she learns of additional information.  Patient ethnicity reported as Black and FOB ethnicity reported as Hispanic. Denies Ashkenazi Jewish ancestry.  Family history not remarkable for consanguinity, individuals with birth defects, intellectual disability, autism spectrum disorder, multiple spontaneous abortions, still births, or unexplained neonatal death.   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 Therefore, she makes both the typical hemoglobin A and the atypical hemoglobin S (HbA/S). This is typically not associated with symptoms in the carrier. Please see report for details.  We reviewed beta globin genes, hemoglobin, forms of  beta-hemoglobinopathies and their natural histories, and the autosomal mode of inheritance. Michelle Calhoun will either pass down either the normal beta globin gene that produces the normal hemoglobin A or the altered beta globin gene that produces hemoglobin S. There would be a 25% risk for the pregnancy to be affected with sickle cell disease (HbS/S) if Michelle Calhoun's reproductive partner is also a carrier. There are several types of beta-hemoglobinopathies, including hemoglobin C, hemoglobin O, hemoglobin E, and beta-thalassemia. If Michelle Calhoun's reproductive partner is found to be a carrier for for a beta-hemoglobinopathy, there would be a 25% chance that this pregnancy would be affected. The type of beta-hemoglobinopathy and symptoms will vary depending on the genotype.   Given these results, we discussed and offered carrier screening for Michelle Calhoun's reproductive partner. We reviewed the benefits and limitations of carrier screening and that it can detect most but not all carriers.  She would like FOB Michelle Calhoun to have carrier screening. She stated that he will have his screening performed during her next OB visit on 04/16/2024. We reviewed that if both were found to be carriers, prenatal diagnosis through amniocentesis would be available. We reviewed the technical aspects, benefits, risks, and limitations of  including the 1 in 500 risk for preterm delivery. She declined amniocentesis and prefers to wait for Newborn Screening.  In the meantime, we calculated the risk for the current pregnancy to be affected with a beta-hemoglobinopathy. Based on Michelle Calhoun's carrier screening results and her partner's ethnicity, the current pregnancy is at a 1 in 82 (~1.5%) risk to be affected.  Of note, Tarisa was not found to be a carrier for the other  three two conditions screened for (alpha thalassemia, CF, and SMA). This significantly reduces but does not eliminate the chance of being a carrier. Please see report for details.   Newborn  Screening. The [place]  Newborn Screening (NBS) program will screen all newborn babies for cystic fibrosis, spinal muscular atrophy, hemoglobinopathies, and numerous other conditions.  Previous Testing Completed:  Low risk NIPS: Eleonora previously completed Panorama noninvasive prenatal screening (NIPS) in this pregnancy. The result is low risk, consistent with a female fetus. This screening significantly reduces but does not eliminate the chance that the current pregnancy has Down syndrome (trisomy 59), trisomy 4, trisomy 71, and common sex chromosome conditions. Please see report for details. There are many genetic conditions that cannot be detected by NIPS.   Negative ms-AFP screening: Simrin previously completed a maternal serum AFP screen in this pregnancy. The result is screen negative. Please see report for details. A negative result reduces the risk that the current pregnancy has an open neural tube defect. Closed neural tube defects and some open defects may not be detected by this screen.   Plan of Care:   FOB Horizon carrier screening for beta-hemoglobinopathies (including sickle cell) to be performed at [facility] office on 04/16/2024. We will follow-up with the results. Declined amniocentesis. Routine prenatal care.   Informed consent was obtained. All questions were answered.   40 minutes were spent on the date of the encounter in service to the patient including preparation, face-to-face consultation, discussion of test reports and available next steps, pedigree construction, genetic risk assessment, documentation, and care coordination.    Thank you for sharing in the care of Cailah with us .  Please do not hesitate to contact us  at [phone] if you have any questions.   Lauraine Bodily, MS, [facility] Certified Genetic Counselor   Genetic counseling student involved in appointment: No.

## 2024-04-07 NOTE — Progress Notes (Addendum)
 Patient information  Patient Name: Michelle Calhoun  Patient MRN:   [id number]  Referring practice: MFM Referring Provider: [facility] ([facility])  Problem List   Patient Active Problem List   Diagnosis Date Noted   Fetal echogenic intracardiac focus on prenatal ultrasound 04/07/2024   History of gestational hypertension 04/07/2024   Asthma affecting pregnancy in second trimester 04/07/2024   Sickle cell trait 01/24/2024   Supervision of other normal pregnancy, antepartum 12/31/2023    Maternal Fetal Medicine Consult Michelle Calhoun is a 28 y.o. G3P2002 at [redacted]w[redacted]d here for ultrasound and consultation. She had low risk aneuploidy screening (does not want to know gender but the results are concordant with the ultrasound). Carrier screening was positive for sickle cell trait. Maternal serum AFP was negative. She has no acute concerns.   Today we focused on the following:   Sickle cell trait: Today we briefly discussed the clinical implications of sickle cell anemia as well as the genetic pattern of inheritance.  The patient will have genetic counseling to follow today's visit.  Echogenic intracardiac focus: An intracardiac echogenic focus was noted on today's ultrasound. This is thought to represent a calcification of the ventricular papillary muscles.  There is no adverse cardiac function associated with this finding.  This is seen in about 3% to 4% of normal pregnancies but is higher in a pregnancy affected by Down Syndrome. Historically there was association with aneuploidy but in the presence of normal aneuploidy screening this is considered a normal variant. I reassured the patient that the likelihood of Down syndrome is extremely low at less than 1 per 10,000.  History of gestational hypertension: 81 mg of aspirin is recommended to be continued throughout the pregnancy to reduce the risk of hypertensive disorders of pregnancy.  If not previously done she should have  baseline CMP, CBC and urine protein creatinine ratio.  Sonographic findings Single intrauterine pregnancy at 24w 1d. Fetal cardiac activity:  Observed and appears normal. Presentation: Cephalic. The anatomic structures that were well seen appear normal.  The echogenic intracardiac focus is a normal variant in the setting of low risk aneuploidy screening. The anatomic survey is complete.  Fetal biometry shows the estimated fetal weight at the 36 percentile. Amniotic fluid: Within normal limits.  MVP: 6 cm. Placenta: Posterior Fundal. Adnexa: No abnormality visualized. Cervical length: 3.4 cm.  Recommendations - Aneuploidy screening was completed at the [facility] office - Anatomy ultrasound was done today with the above findings (see report). - Genetic counseling to follow today's visit - 81 mg of aspirin is recommended to be continued throughout the pregnancy to reduce the risk of hypertensive disorders of pregnancy.  If not previously done she should have baseline CMP, CBC and urine protein creatinine ratio. - No further ultrasounds are recommended at this time based on the current indications. If future indications arise (e.g. size/date discrepancy on fundal height, gestational diabetes or hypertension) and an ultrasound is to be desired at our MFM office, please send a referral.   Review of Systems: A review of systems was performed and was negative except per HPI   Past Obstetrical History:  OB History  Gravida Para Term Preterm AB Living  3 2 2   2   SAB IAB Ectopic Multiple Live Births     0 2    # Outcome Date GA Lbr Len/2nd Weight Sex Type Anes PTL Lv  3 Current           2 Term  09/17/22 [redacted]w[redacted]d 04:41 / 00:18 6 lb 12.6 oz (3.08 kg) M Vag-Spont EPI  LIV  1 Term 08/15/15 [redacted]w[redacted]d 16:40 / 01:59 7 lb 0.9 oz (3.2 kg) F Vag-Spont EPI  LIV     Past Medical History:  Past Medical History:  Diagnosis Date   Asthma    Hx of chlamydia infection    Hypertension    Vaginal Pap smear, abnormal       Past Surgical History:    Past Surgical History:  Procedure Laterality Date   NO PAST SURGERIES       Home Medications:   Current Outpatient Medications on File Prior to Visit  Medication Sig Dispense Refill   Prenatal Vit-Fe Fumarate-FA (PRENATAL MULTIVITAMIN) TABS tablet Take 1 tablet by mouth daily at 12 noon.     sertraline  (ZOLOFT ) 50 MG tablet Take 0.5 tablets (25 mg total) by mouth daily. 30 tablet 6   [DISCONTINUED] amLODipine  (NORVASC ) 5 MG tablet Take 1 tablet (5 mg total) by mouth daily. 30 tablet 3   [DISCONTINUED] diphenhydrAMINE  (BENADRYL ) 25 MG tablet Take 25 mg by mouth every 6 (six) hours as needed for allergies or sleep.     [DISCONTINUED] triamterene -hydrochlorothiazide  (DYAZIDE) 37.5-25 MG capsule Take 1 each (1 capsule total) by mouth daily. 30 capsule 1   No current facility-administered medications on file prior to visit.      Allergies:   No Known Allergies   Physical Exam:   Vitals:   04/07/24 0925  BP: 109/63   Sitting comfortably on the sonogram table Nonlabored breathing Normal rate and rhythm Abdomen is nontender  Thank you for the opportunity to be involved with this patient's care. Please let us  know if we can be of any further assistance.   45 minutes of time was spent reviewing the patient's chart including labs, imaging and documentation.  At least 50% of this time was spent with direct patient care discussing the diagnosis, management and prognosis of her care.  Delora Smaller MFM, [place]   04/07/2024  10:50 AM

## 2024-04-11 ENCOUNTER — Encounter (HOSPITAL_BASED_OUTPATIENT_CLINIC_OR_DEPARTMENT_OTHER): Payer: Self-pay | Admitting: Emergency Medicine

## 2024-04-11 ENCOUNTER — Emergency Department (HOSPITAL_BASED_OUTPATIENT_CLINIC_OR_DEPARTMENT_OTHER)
Admission: EM | Admit: 2024-04-11 | Discharge: 2024-04-11 | Disposition: A | Discharge status: Home / Self Care | Attending: Emergency Medicine | Admitting: Emergency Medicine

## 2024-04-11 ENCOUNTER — Other Ambulatory Visit: Payer: Self-pay

## 2024-04-11 DIAGNOSIS — M5431 Sciatica, right side: Secondary | ICD-10-CM | POA: Diagnosis not present

## 2024-04-11 DIAGNOSIS — J45909 Unspecified asthma, uncomplicated: Secondary | ICD-10-CM | POA: Insufficient documentation

## 2024-04-11 DIAGNOSIS — O9952 Diseases of the respiratory system complicating childbirth: Secondary | ICD-10-CM | POA: Diagnosis not present

## 2024-04-11 DIAGNOSIS — M5441 Lumbago with sciatica, right side: Secondary | ICD-10-CM | POA: Diagnosis not present

## 2024-04-11 DIAGNOSIS — O139 Gestational [pregnancy-induced] hypertension without significant proteinuria, unspecified trimester: Secondary | ICD-10-CM | POA: Diagnosis not present

## 2024-04-11 DIAGNOSIS — O99891 Other specified diseases and conditions complicating pregnancy: Secondary | ICD-10-CM | POA: Insufficient documentation

## 2024-04-11 DIAGNOSIS — Z3A25 25 weeks gestation of pregnancy: Secondary | ICD-10-CM | POA: Insufficient documentation

## 2024-04-11 MED ORDER — LIDOCAINE 5 % EX PTCH
1.0000 | MEDICATED_PATCH | CUTANEOUS | Status: DC
  Administered 2024-04-11: 1 via TRANSDERMAL
  Filled 2024-04-11: qty 1

## 2024-04-11 MED ORDER — METHOCARBAMOL 500 MG PO TABS: 500.0000 mg | ORAL_TABLET | Freq: Two times a day (BID) | ORAL | 0 refills | Status: DC | PRN

## 2024-04-11 MED ORDER — LIDOCAINE 5 % EX PTCH: 1.0000 | MEDICATED_PATCH | CUTANEOUS | 0 refills | Status: DC

## 2024-04-11 NOTE — ED Triage Notes (Signed)
 Pt c/o pain to upper RLE; pain and numbness from RT buttock to knee; intermittent pain x 2 wks, but worse now; [redacted] wks pregnant

## 2024-04-11 NOTE — Discharge Instructions (Addendum)
 It was a pleasure taking care of you today.  Based on your history and physical exam I feel you are safe for discharge.  Today your physical exam was most consistent with something called sciatica which is inflammation of a large nerve that runs out of your spine and your lower back and down your leg behind your butt cheek area.  When this nerve becomes inflamed it can cause pain/weakness/numbness/tingling.  Due to your pregnancy treatment options are somewhat limited.  I recommend Tylenol  and I have also sent in a prescription for lidocaine  patches as well as Robaxin which is a muscle relaxant medication.  Please do not take the muscle relaxant medication if you will be driving or operating heavy machinery as it may make you drowsy.  Please only use this muscle relaxant when absolutely necessary, as like with any medication there is a potential risk in pregnancy.  If you would like to reach out to your OB/GYN before taking this medication you may. Please place the lidocaine  patch over intact skin on your right gluteal area.  If you experience the following symptoms including but limited to fever, chills, chest pain, shortness of breath, abdominal pain, pelvic pain, inability to walk, worsening radiating numbness/tingling or weakness, severe back pain, or other concerning symptom please return to the emergency department or seek further medical care.  Follow-up with OB/GYN as scheduled, sooner if symptoms warrant.

## 2024-04-12 ENCOUNTER — Telehealth: Payer: Self-pay | Admitting: Family Medicine

## 2024-04-12 MED ORDER — GABAPENTIN 100 MG PO CAPS: 100.0000 mg | ORAL_CAPSULE | Freq: Three times a day (TID) | ORAL | 0 refills | Status: DC

## 2024-04-12 NOTE — ED Provider Notes (Signed)
 [facility] Provider Note   CSN: [id number] Arrival date & time: 04/11/24  2127     Patient presents with: Leg Pain   Michelle Calhoun is a 28 y.o. female who is approximately [redacted] weeks pregnant who presents to the emergency department with a chief complaint of right leg pain that radiates all the way down from her gluteal region down to the level of her right knee.  Patient states that she has been experiencing the pain episodically for approximately the last 2 weeks however it is becoming more frequent and more severe.  She spoke with her OB/GYN who recommended Tylenol  as well as IcyHot.  Patient states that she has been using Tylenol  and IcyHot without any relief.  Denies trauma/injury, denies groin numbness/tingling, bowel/bladder incontinence.  Patient denies history of known back problems or surgeries.  Patient states that she is ambulatory without assistance however walking is painful.  Patient also appreciates severe pain at times when sitting specifically when putting pressure on her right side.  Patient states that her pregnancy has been uncomplicated thus far, past medical history significant for sickle cell trait, gestational hypertension, asthma, etc.    Leg Pain      Prior to Admission medications   Medication Sig Start Date End Date Taking? Authorizing Provider  lidocaine  (LIDODERM ) 5 % Place 1 patch onto the skin daily. Remove & Discard patch within 12 hours or as directed by MD 04/11/24  Yes Raequan Vanschaick F, PA-C  methocarbamol (ROBAXIN) 500 MG tablet Take 1 tablet (500 mg total) by mouth 2 (two) times daily as needed for muscle spasms. 04/11/24  Yes Itali Mckendry F, PA-C  Prenatal Vit-Fe Fumarate-FA (PRENATAL MULTIVITAMIN) TABS tablet Take 1 tablet by mouth daily at 12 noon.    [provider]  sertraline  (ZOLOFT ) 50 MG tablet Take 0.5 tablets (25 mg total) by mouth daily. 02/20/24   Stinson, Jacob J, DO   amLODipine  (NORVASC ) 5 MG tablet Take 1 tablet (5 mg total) by mouth daily. 08/18/15 09/27/19  Jayne Vonn DEL, MD  diphenhydrAMINE  (BENADRYL ) 25 MG tablet Take 25 mg by mouth every 6 (six) hours as needed for allergies or sleep.  09/27/19  [provider]  triamterene -hydrochlorothiazide  (DYAZIDE) 37.5-25 MG capsule Take 1 each (1 capsule total) by mouth daily. 08/18/15 09/27/19  Jayne Vonn DEL, MD    Allergies: Patient has no known allergies.    Review of Systems  Musculoskeletal:  Positive for myalgias (Right leg pain).    Updated Vital Signs BP 111/71   Pulse 89   Temp 98.9 F (37.2 C)   Resp 18   Ht 5' 3 (1.6 m)   Wt 63.5 kg   LMP 10/21/2023   SpO2 100%   BMI 24.80 kg/m   Physical Exam Vitals and nursing note reviewed.  Constitutional:      General: She is awake. She is not in acute distress.    Appearance: Normal appearance. She is not ill-appearing, toxic-appearing or diaphoretic.  HENT:     Head: Normocephalic and atraumatic.  Eyes:     General: No scleral icterus. Cardiovascular:     Rate and Rhythm: Normal rate and regular rhythm.  Pulmonary:     Effort: Pulmonary effort is normal. No respiratory distress.     Breath sounds: No wheezing, rhonchi or rales.  Abdominal:     Tenderness: There is no right CVA tenderness or left CVA tenderness.  Musculoskeletal:  General: Normal range of motion.     Right lower leg: No edema.     Left lower leg: No edema.     Comments: Positive right straight leg raise test, right gluteal region point tenderness with palpation over area of sciatic nerve, pelvis feels stable, no tenderness to palpation of right or left hip, no tenderness of right knee, no tenderness of lumbar spine  Right lower extremity neurovascularly intact  Skin:    General: Skin is warm.     Capillary Refill: Capillary refill takes less than 2 seconds.  Neurological:     General: No focal deficit present.     Mental Status: She is alert and  oriented to person, place, and time.     Gait: Gait abnormal.  Psychiatric:        Mood and Affect: Mood normal.        Behavior: Behavior normal. Behavior is cooperative.     (all labs ordered are listed, but only abnormal results are displayed) Labs Reviewed - No data to display  EKG: None  Radiology: No results found.   Procedures   Medications Ordered in the ED  lidocaine  (LIDODERM ) 5 % 1 patch (1 patch Transdermal Patch Applied 04/11/24 2237)                                    Medical Decision Making Risk Prescription drug management.   Patient presents to the ED for concern of right leg pain, this involves an extensive number of treatment options, and is a complaint that carries with it a high risk of complications and morbidity.  The differential diagnosis includes fracture, dislocation, sciatica, trauma/injury, soft tissue injury, ligament/tendon injury, etc.   Co morbidities that complicate the patient evaluation  sickle cell trait, gestational hypertension, asthma   Medicines ordered and prescription drug management:  I ordered medication including lidocaine  patch for pain Reevaluation of the patient after these medicines showed that the patient improved I have reviewed the patients home medicines and have made adjustments as needed   Test Considered:  Imaging of lumbar spine: Declined at this time as patient has no tenderness of lumbar spine, denies groin numbness/tingling, bowel/bladder incontinence or retention, patient is also pregnant, physical exam more consistent with sciatica, I do not believe imaging would change management at this time   Critical Interventions:  None  Problem List / ED Course:  28 year old female, vital signs stable, [redacted] weeks pregnant, history seems consistent with sciatica, attempting supportive care at home including Tylenol  and IcyHot without relief On physical exam point tenderness over sciatic nerve distribution area  in right gluteal region, positive right straight leg raise test, no red flag back pain symptoms including groin numbness/tingling, urinary retention/incontinence or bowel retention or incontinence, I do not believe imaging would change management at this time Patient was offered Tylenol  however took Tylenol  just prior to coming the emergency department, would prefer to stay away from anti-inflammatory medications due to pregnancy, patient given lidocaine  patch here in the emergency department, could not give muscle relaxant as patient drove herself here Patient educated that I feel the most likely diagnosis for symptoms is sciatica, educated on outpatient treatment including Tylenol , muscle relaxants, lidocaine  patches Return precautions given Patient discharged   Reevaluation:  After the interventions noted above, I reevaluated the patient and found that they have :improved   Social Determinants of Health:  None   Dispostion:  After consideration of the diagnostic results and the patients response to treatment, I feel that the patient would benefit from discharge and outpatient therapy as described, follow-up with OB/GYN.       Final diagnoses:  Sciatica of right side    ED Discharge Orders          Ordered    lidocaine  (LIDODERM ) 5 %  Every 24 hours        04/11/24 2323    methocarbamol (ROBAXIN) 500 MG tablet  2 times daily PRN        04/11/24 2323               Neira Bentsen F, PA-C 04/12/24 0144    Rogelia Jerilynn RAMAN, MD 04/12/24 1423

## 2024-04-12 NOTE — Telephone Encounter (Signed)
 Received a call from access nurse regarding patient's prescription for Robaxin which she was prescribed by [facility] provider.  Discussed with access nurse that typically I would use Flexeril  over Robaxin but if she is having true sciatica symptoms gabapentin could also be helpful.  Attempted to call the patient regarding switching medications to gabapentin but did not answer.  Prescription sent and MyChart message sent with instructions for patient.

## 2024-04-15 ENCOUNTER — Ambulatory Visit (INDEPENDENT_AMBULATORY_CARE_PROVIDER_SITE_OTHER): Admitting: Obstetrics & Gynecology

## 2024-04-15 VITALS — BP 120/72 | HR 90 | Wt 136.0 lb

## 2024-04-15 DIAGNOSIS — Z3A25 25 weeks gestation of pregnancy: Secondary | ICD-10-CM

## 2024-04-15 DIAGNOSIS — Z8759 Personal history of other complications of pregnancy, childbirth and the puerperium: Secondary | ICD-10-CM | POA: Diagnosis not present

## 2024-04-15 DIAGNOSIS — Z3480 Encounter for supervision of other normal pregnancy, unspecified trimester: Secondary | ICD-10-CM | POA: Diagnosis not present

## 2024-04-15 MED ORDER — ASPIRIN 81 MG PO TBEC: 81.0000 mg | DELAYED_RELEASE_TABLET | Freq: Every day | ORAL | 2 refills | Status: DC

## 2024-04-15 NOTE — Progress Notes (Signed)
 PRENATAL VISIT NOTE  Subjective:  Michelle Calhoun is a 28 y.o. G3P2002 at [redacted]w[redacted]d being seen today for ongoing prenatal care.  She is currently monitored for the following issues for this low-risk pregnancy and has Supervision of other normal pregnancy, antepartum; Sickle cell trait; Fetal echogenic intracardiac focus on prenatal ultrasound; History of gestational hypertension; and Asthma affecting pregnancy in second trimester on their problem list.  Patient reports occasional sciatic pain, has filled out paperwork to be granted access for disabled parking.  Contractions: Not present. Vag. Bleeding: None.  Movement: Present. Denies leaking of fluid.   The following portions of the patient's history were reviewed and updated as appropriate: allergies, current medications, past family history, past medical history, past social history, past surgical history and problem list.   Objective:    Vitals:   04/15/24 1414  BP: 120/72  Pulse: 90  Weight: 136 lb (61.7 kg)    Fetal Status:  Fetal Heart Rate (bpm): 152 Fundal Height: 25 cm Movement: Present    General: Alert, oriented and cooperative. Patient is in no acute distress.  Skin: Skin is warm and dry. No rash noted.   Cardiovascular: Normal heart rate noted  Respiratory: Normal respiratory effort, no problems with respiration noted  Abdomen: Soft, gravid, appropriate for gestational age.  Pain/Pressure: Present     Pelvic: Cervical exam deferred        Extremities: Normal range of motion.  Edema: Trace  Mental Status: Normal mood and affect. Normal behavior. Normal judgment and thought content.   US  MFM OB DETAIL +14 WK Result Date: 04/07/2024 ----------------------------------------------------------------------  OBSTETRICS REPORT                       (Signed Final 04/07/2024 10:50 am) ---------------------------------------------------------------------- Patient Info  ID #:       [id number]                          D.O.B.:   Sep 19, 1995 (28 yrs)(F)  Name:       Michelle Calhoun             Visit Date: 04/07/2024 09:37 am ---------------------------------------------------------------------- Performed By  Attending:        Delora Smaller DO       Ref. Address:     [address]                                                             [place], [place]                                                             [address]  Performed By:     Emelia Coombs BS,       [facility]  Referred By:      LANG JINNY PEEL                    DO ---------------------------------------------------------------------- Orders  #  Description                           Code        Ordered By  1  US  MFM OB DETAIL +14 WK               76811.01    LANG PEEL ----------------------------------------------------------------------  #  Order #                     Accession #                Episode #  1  [id number]                   [id number]                 [id number] ---------------------------------------------------------------------- Indications  Echogenic intracardiac focus of the heart      O35.8XX0  (EIF)  Poor obstetric history: Previous gestational   O09.299  HTN  [redacted] weeks gestation of pregnancy                Z3A.24  Encounter for antenatal screening for          Z36.3  malformations  History of sickle cell trait                   Z86.2  Asthma                                         O99.89 j45.909  NEG AFP/ LR NIPS ---------------------------------------------------------------------- Fetal Evaluation  Num Of Fetuses:         1  Fetal Heart Rate(bpm):  164  Cardiac Activity:       Observed  Presentation:           Cephalic  Placenta:               Posterior Fundal  P. Cord Insertion:      Visualized  Amniotic Fluid  AFI FV:       Within normal limits                              Largest Pocket(cm)  6 ---------------------------------------------------------------------- Biometry  BPD:      59.7  mm     G. Age:  24w 3d         51  %    CI:        74.17   %    70 - 86                                                          FL/HC:      18.8   %    18.7 - 20.9  HC:      220.1  mm     G. Age:  24w 0d         28  %    HC/AC:      1.11        1.05 - 1.21  AC:      197.8  mm     G. Age:  24w 3d         51  %    FL/BPD:     69.3   %    71 - 87  FL:       41.4  mm     G. Age:  23w 3d         18  %    FL/AC:      20.9   %    20 - 24  HUM:      39.4  mm     G. Age:  24w 0d         40  %  CER:      26.4  mm     G. Age:  23w 5d         52  %  LV:        3.9  mm  CM:        5.3  mm  Est. FW:     654  gm      1 lb 7 oz     36  % ---------------------------------------------------------------------- OB History  Blood Type:   A+  Gravidity:    3         Term:   2  Living:       2 ---------------------------------------------------------------------- Gestational Age  LMP:           24w 1d        Date:  10/21/23                  EDD:   07/27/24  U/S Today:     24w 1d                                        EDD:   07/27/24  Best:          24w 1d     Det. By:  LMP  (10/21/23)          EDD:   07/27/24 ---------------------------------------------------------------------- Targeted Anatomy  Central Nervous System  Calvarium/Cranial V.:  Appears normal         Cereb./Vermis:          Appears normal  Cavum:  Appears normal         Cisterna Magna:         Appears normal  Lateral Ventricles:    Appears normal         Midline Falx:           Appears normal  Choroid Plexus:        Appears normal  Spine  Cervical:              Appears normal         Sacral:                 Appears normal  Thoracic:              Appears normal         Shape/Curvature:        Appears normal  Lumbar:                Appears normal  Head/Neck  Lips:                   Appears normal         Profile:                Appears normal  Neck:                  Appears normal         Orbits/Eyes:            Appears normal  Nuchal Fold:           Not applicable         Mandible:               Appears normal  Nasal Bone:            Present                Maxilla:                Appears normal  Thorax  4 Chamber View:        Appears normal         Interventr. Septum:     Appears normal  Cardiac Rhythm:        Normal                 Cardiac Axis:           Normal  Cardiac Situs:         Appears normal         Diaphragm:              Appears normal  Rt Outflow Tract:      Limited                3 Vessel View:          Appears normal  Lt Outflow Tract:      Appears normal         3 V Trachea View:       Appears normal  Aortic Arch:           Appears normal         IVC:                    Appears normal  Ductal Arch:           Appears normal         Crossing:  Appears normal  SVC:                   Appears normal  Abdomen  Ventral Wall:          Appears normal         Lt Kidney:              Appears normal  Cord Insertion:        Appears normal         Rt Kidney:              Appears normal  Situs:                 Appears normal         Bladder:                Appears normal  Stomach:               Appears normal  Extremities  Lt Humerus:            Appears normal         Lt Femur:               Appears normal  Rt Humerus:            Appears normal         Rt Femur:               Appears normal  Lt Forearm:            Appears normal         Lt Lower Leg:           Appears normal  Rt Forearm:            Appears normal         Rt Lower Leg:           Appears normal  Lt Hand:               Open hand nml          Lt Foot:                Nml heel/foot  Rt Hand:               Open hand nml          Rt Foot:                Nml heel/foot  Other  Umbilical Cord:        Normal 3-vessel        Genitalia:              Female-nml  ---------------------------------------------------------------------- Cervix Uterus Adnexa  Cervix  Length:            3.4  cm.  Normal appearance by transabdominal scan  Uterus  No abnormality visualized.  Right Ovary  Size(cm)     2.19   x   2.24   x  1.82      Vol(ml): 4.67  Within normal limits.  Left Ovary  Size(cm)     2.68   x   1.89   x  1.24      Vol(ml): 3.29  Within normal limits.  Cul De Sac  No free fluid seen.  Adnexa  No abnormality visualized ---------------------------------------------------------------------- Comments  Maternal Fetal Medicine Consult  Michelle Calhoun is a 28 y.o. G3P2002 at [redacted]w[redacted]d  here for ultrasound and consultation.  She had low risk  aneuploidy screening (does not want to know gender but the  results are concordant with the ultrasound). Carrier screening  was positive for sickle cell trait. Maternal serum AFP was  negative. She has no acute concerns.  Today we focused on the following:  Sickle cell trait: Today we briefly discussed the clinical  implications of sickle cell anemia as well as the genetic  pattern of inheritance.  The patient will have genetic  counseling to follow today's visit.  Echogenic intracardiac focus: An intracardiac echogenic  focus was noted on today's ultrasound. This is thought to  represent a calcification of the ventricular papillary muscles.  There is no adverse cardiac function associated with this  finding.  This is seen in about 3% to 4% of normal  pregnancies but is higher in a pregnancy affected by Down  Syndrome. Historically there was association with aneuploidy  but in the presence of normal aneuploidy screening this is  considered a normal variant. I reassured the patient that the  likelihood of Down syndrome is extremely low at less than 1  per 10,000.  History of gestational hypertension: 81 mg of aspirin is  recommended to be continued throughout the pregnancy to  reduce the risk of hypertensive disorders of pregnancy.  If not   previously done she should have baseline CMP, CBC and  urine protein creatinine ratio.  Sonographic findings  Single intrauterine pregnancy at 24w 1d.  Fetal cardiac activity:  Observed and appears normal.  Presentation: Cephalic.  The anatomic structures that were well seen appear normal.  The echogenic intracardiac focus is a normal variant in the  setting of low risk aneuploidy screening. The anatomic survey  is complete.  Fetal biometry shows the estimated fetal weight at the 36  percentile.  Amniotic fluid: Within normal limits.  MVP: 6 cm.  Placenta: Posterior Fundal.  Adnexa: No abnormality visualized.  Cervical length: 3.4 cm.  Recommendations  - Aneuploidy screening was completed at the [facility] office  - Anatomy ultrasound was done today with the above findings  (see report).  - Genetic counseling to follow today's visit  - 81 mg of aspirin is recommended to be continued  throughout the pregnancy to reduce the risk of hypertensive  disorders of pregnancy.  If not previously done she should  have baseline CMP, CBC and urine protein creatinine ratio.  - No further ultrasounds are recommended at this time based  on the current indications. If future indications arise (e.g.  size/date discrepancy on fundal height, gestational diabetes  or hypertension) and an ultrasound is to be desired at our  MFM office, please send a referral. ----------------------------------------------------------------------                  Delora Smaller, DO Electronically Signed Final Report   04/07/2024 10:50 am ----------------------------------------------------------------------    Assessment and Plan:  Pregnancy: G3P2002 at [redacted]w[redacted]d 1. History of gestational hypertension BP stable, patient needs to continue ASA - aspirin EC 81 MG tablet; Take 1 tablet (81 mg total) by mouth at bedtime. Start taking when you are [redacted] weeks pregnant for rest of pregnancy for prevention of preeclampsia  Dispense: 300 tablet; Refill: 2  2. [redacted] weeks  gestation of pregnancy 3. Supervision of other normal pregnancy, antepartum (Primary) Patient was informed I do not usually give disability parking access in pregnancy, but she can feel free to ask her other providers. Analgesia recommended as needed for sciatic pain. Discussed GTT and labs next visit.  Preterm labor symptoms and general obstetric precautions including but not limited to vaginal bleeding, contractions, leaking of fluid and fetal movement were reviewed in detail with the patient. Please refer to After Visit Summary for other counseling recommendations.   Return in about 3 weeks (around 05/06/2024) for 2 hr GTT, 3rd trimester labs, TDap, OFFICE OB VISIT (MD or APP).  Future Appointments  Date Time Provider Department Center  05/11/2024  8:15 AM Dunn, Rollo DASEN, MD [facility] None    Gloris Hugger, MD

## 2024-04-15 NOTE — Patient Instructions (Signed)
 Oral Glucose Tolerance Test During Pregnancy Why am I having this test? The oral glucose tolerance test (GTT) is done to check how your body processes blood sugar (glucose). This is one of several tests used to diagnose diabetes that develops during pregnancy (gestational diabetes mellitus). Gestational diabetes is a short-term form of diabetes that some women develop while they are pregnant. It usually occurs during the second or third trimester of pregnancy and goes away after delivery. Testing, or screening, for gestational diabetes usually occurs around 69 of pregnancy. This test may also be needed earlier if: You have a history of gestational diabetes. There is a history of giving birth to very large babies or of losing pregnancies (having stillbirths). You have signs and symptoms of diabetes, such as: Changes in your eyesight. Tingling or numbness in your hands or feet. Changes in hunger, thirst, and urination, and these are not explained by your pregnancy. What is being tested? This test measures the amount of glucose in your blood at different times during a period of 2 hours. This shows how well your body can process glucose.  You will have three separate blood draws. What kind of sample is taken?  Blood samples are required for this test. They are usually collected by inserting a needle into a blood vessel. How do I prepare for this test? For 3 days before your test, eat normally. Have plenty of carbohydrate-rich foods. You will be asked not to eat or drink anything other than water (to fast) starting 8-10 hours before the test. Tell a health care provider about: All medicines you are taking, including vitamins, herbs, eye drops, creams, and over-the-counter medicines. Any blood disorders you have. Any surgeries you have had. Any medical conditions you have. What happens during the test? First, your blood glucose will be measured. This is referred to as your fasting blood glucose  because you fasted before the test. Then, you will drink a glucose solution that contains a certain amount of glucose. Your blood glucose will be measured again 1 and 2 hours after you drink the solution. This test takes about 2 hours to complete. You will need to stay at the testing location during this time. During the testing period: Do not eat or drink anything other than the glucose solution. Do not exercise. Do not use any products that contain nicotine or tobacco, such as cigarettes, e-cigarettes, and chewing tobacco. These can affect your test results. If you need help quitting, ask your health care provider. The testing procedure may vary among health care providers and hospitals. How are the results reported? Your results will be reported as milligrams of glucose per deciliter of blood (mg/dL) or millimoles per liter (mmol/L). There is more than one source for screening and diagnosis reference values used to diagnose gestational diabetes. Your health care provider will compare your results to normal values that were established after testing a large group of people (reference values). Reference values may vary among labs and hospitals. For this test, reference values are: Fasting: 92 mg/dL 1 hour: 161 mg/dL  2 hour: 096 mg/dL   What do the results mean? Results below the reference values are considered normal. If one or more of your blood glucose levels are at or above the reference values, you will be diagnosed with gestational diabetes.  Talk with your health care provider about what your results mean. Questions to ask your health care provider Ask your health care provider, or the department that is doing the test: When  will my results be ready? How will I get my results? What are my treatment options? What other tests do I need? What are my next steps? Summary The oral glucose tolerance test (GTT) is one of several tests used to diagnose diabetes that develops during pregnancy  (gestational diabetes mellitus). Gestational diabetes is a short-term form of diabetes that some women develop while they are pregnant. You may also have this test if you have any symptoms or risk factors for this type of diabetes. Talk with your health care provider about what your results mean. This information is not intended to replace advice given to you by your health care provider. Make sure you discuss any questions you have with your health care provider.  TDaP Vaccine Pregnancy Get the Whooping Cough Vaccine While You Are Pregnant (CDC)  It is important for women to get the whooping cough vaccine in the third trimester of each pregnancy. Vaccines are the best way to prevent this disease. There are 2 different whooping cough vaccines. Both vaccines combine protection against whooping cough, tetanus and diphtheria, but they are for different age groups: Tdap: for everyone 11 years or older, including pregnant women  DTaP: for children 2 months through 45 years of age  You need the whooping cough vaccine during each of your pregnancies The recommended time to get the shot is during your 27th through 36th week of pregnancy, preferably during the earlier part of this time period. The Centers for Disease Control and Prevention (CDC) recommends that pregnant women receive the whooping cough vaccine for adolescents and adults (called Tdap vaccine) during the third trimester of each pregnancy. The recommended time to get the shot is during your 27th through 36th week of pregnancy, preferably during the earlier part of this time period. This replaces the original recommendation that pregnant women get the vaccine only if they had not previously received it. The [employer]nec[employer] and the [employer] support this recommendation.  You should get the whooping cough vaccine while pregnant to pass protection to your baby frame support disabled  and/or not supported in this browser  Learn why Vernona Rieger decided to get the whooping cough vaccine in her 3rd trimester of pregnancy and how her baby girl was born with some protection against the disease. Also available on YouTube. After receiving the whooping cough vaccine, your body will create protective antibodies (proteins produced by the body to fight off diseases) and pass some of them to your baby before birth. These antibodies provide your baby some short-term protection against whooping cough in early life. These antibodies can also protect your baby from some of the more serious complications that come along with whooping cough. Your protective antibodies are at their highest about 2 weeks after getting the vaccine, but it takes time to pass them to your baby. So the preferred time to get the whooping cough vaccine is early in your third trimester. The amount of whooping cough antibodies in your body decreases over time. That is why CDC recommends you get a whooping cough vaccine during each pregnancy. Doing so allows each of your babies to get the greatest number of protective antibodies from you. This means each of your babies will get the best protection possible against this disease.  Getting the whooping cough vaccine while pregnant is better than getting the vaccine after you give birth Whooping cough vaccination during pregnancy is ideal so your baby will have short-term protection as soon as he  is born. This early protection is important because your baby will not start getting his whooping cough vaccines until he is 2 months old. These first few months of life are when your baby is at greatest risk for catching whooping cough. This is also when he's at greatest risk for having severe, potentially life-threating complications from the infection. To avoid that gap in protection, it is best to get a whooping cough vaccine during pregnancy. You will then pass protection to your baby before he  is born. To continue protecting your baby, he should get whooping cough vaccines starting at 2 months old. You may never have gotten the Tdap vaccine before and did not get it during this pregnancy. If so, you should make sure to get the vaccine immediately after you give birth, before leaving the hospital or birthing center. It will take about 2 weeks before your body develops protection (antibodies) in response to the vaccine. Once you have protection from the vaccine, you are less likely to give whooping cough to your newborn while caring for him. But remember, your baby will still be at risk for catching whooping cough from others. A recent study looked to see how effective Tdap was at preventing whooping cough in babies whose mothers got the vaccine while pregnant or in the hospital after giving birth. The study found that getting Tdap between 27 through 36 weeks of pregnancy is 85% more effective at preventing whooping cough in babies younger than 2 months old. Blood tests cannot tell if you need a whooping cough vaccine There are no blood tests that can tell you if you have enough antibodies in your body to protect yourself or your baby against whooping cough. Even if you have been sick with whooping cough in the past or previously received the vaccine, you still should get the vaccine during each pregnancy. Breastfeeding may pass some protective antibodies onto your baby By breastfeeding, you may pass some antibodies you have made in response to the vaccine to your baby. When you get a whooping cough vaccine during your pregnancy, you will have antibodies in your breast milk that you can share with your baby as soon as your milk comes in. However, your baby will not get protective antibodies immediately if you wait to get the whooping cough vaccine until after delivering your baby. This is because it takes about 2 weeks for your body to create antibodies. Learn more about the health benefits of  breastfeeding.

## 2024-04-16 ENCOUNTER — Encounter: Admitting: Family Medicine

## 2024-04-18 ENCOUNTER — Encounter: Payer: Self-pay | Admitting: Family Medicine

## 2024-05-07 ENCOUNTER — Encounter: Payer: Self-pay | Admitting: Family Medicine

## 2024-05-07 ENCOUNTER — Other Ambulatory Visit (HOSPITAL_COMMUNITY)
Admission: RE | Admit: 2024-05-07 | Discharge: 2024-05-07 | Disposition: A | Discharge status: Home / Self Care | Source: Ambulatory Visit | Attending: Family Medicine | Admitting: Family Medicine

## 2024-05-07 ENCOUNTER — Ambulatory Visit (INDEPENDENT_AMBULATORY_CARE_PROVIDER_SITE_OTHER): Admitting: Family Medicine

## 2024-05-07 VITALS — BP 115/66 | HR 82 | Wt 138.0 lb

## 2024-05-07 DIAGNOSIS — Z8759 Personal history of other complications of pregnancy, childbirth and the puerperium: Secondary | ICD-10-CM

## 2024-05-07 DIAGNOSIS — Z0289 Encounter for other administrative examinations: Secondary | ICD-10-CM

## 2024-05-07 DIAGNOSIS — O283 Abnormal ultrasonic finding on antenatal screening of mother: Secondary | ICD-10-CM | POA: Diagnosis not present

## 2024-05-07 DIAGNOSIS — N898 Other specified noninflammatory disorders of vagina: Secondary | ICD-10-CM

## 2024-05-07 DIAGNOSIS — Z3480 Encounter for supervision of other normal pregnancy, unspecified trimester: Secondary | ICD-10-CM

## 2024-05-07 DIAGNOSIS — Z1332 Encounter for screening for maternal depression: Secondary | ICD-10-CM

## 2024-05-07 DIAGNOSIS — Z23 Encounter for immunization: Secondary | ICD-10-CM

## 2024-05-07 DIAGNOSIS — Z3A28 28 weeks gestation of pregnancy: Secondary | ICD-10-CM | POA: Diagnosis not present

## 2024-05-07 DIAGNOSIS — Z3493 Encounter for supervision of normal pregnancy, unspecified, third trimester: Secondary | ICD-10-CM | POA: Diagnosis not present

## 2024-05-07 NOTE — Progress Notes (Signed)
 Gad-7 and psq9 done

## 2024-05-07 NOTE — Progress Notes (Signed)
 PRENATAL VISIT NOTE  Subjective:  Michelle Calhoun is a 28 y.o. G3P2002 at [redacted]w[redacted]d being seen today for ongoing prenatal care.  She is currently monitored for the following issues for this high-risk pregnancy and has Supervision of other normal pregnancy, antepartum; Sickle cell trait; Fetal echogenic intracardiac focus on prenatal ultrasound; History of gestational hypertension; and Asthma affecting pregnancy in second trimester on their problem list.  Patient reports vaginal irritation.  Contractions: Not present. Vag. Bleeding: None.  Movement: Present. Denies leaking of fluid.   The following portions of the patient's history were reviewed and updated as appropriate: allergies, current medications, past family history, past medical history, past social history, past surgical history and problem list.   Objective:   Vitals:   05/07/24 0829  BP: 115/66  Pulse: 82  Weight: 138 lb (62.6 kg)    Fetal Status:      Movement: Present    General: Alert, oriented and cooperative. Patient is in no acute distress.  Skin: Skin is warm and dry. No rash noted.   Cardiovascular: Normal heart rate noted  Respiratory: Normal respiratory effort, no problems with respiration noted  Abdomen: Soft, gravid, appropriate for gestational age.  Pain/Pressure: Absent     Pelvic: Cervical exam deferred        Extremities: Normal range of motion.  Edema: None  Mental Status: Normal mood and affect. Normal behavior. Normal judgment and thought content.      05/07/2024    8:34 AM 01/15/2024    8:54 AM 07/13/2015   12:52 PM  Depression screen PHQ 2/9  Decreased Interest 3 1 0  Down, Depressed, Hopeless 0 3 0  PHQ - 2 Score 3 4 0  Altered sleeping 0 2   Tired, decreased energy 1 3   Change in appetite 0 3   Feeling bad or failure about yourself  0 3   Trouble concentrating 0 2   Moving slowly or fidgety/restless 0 0   Suicidal thoughts 0 0   PHQ-9 Score 4 17         05/07/2024    8:34 AM  01/15/2024    8:55 AM  GAD 7 : Generalized Anxiety Score  Nervous, Anxious, on Edge 0 1  Control/stop worrying 0 3  Worry too much - different things 0 3  Trouble relaxing 0 3  Restless 0 0  Easily annoyed or irritable 0 1  Afraid - awful might happen 0 2  Total GAD 7 Score 0 13    Assessment and Plan:  Pregnancy: G3P2002 at 101w3d 1. [redacted] weeks gestation of pregnancy (Primary) - CBC - Glucose Tolerance, 2 Hours w/1 Hour - HIV Antibody (routine testing w rflx) - RPR  2. Vaginal discharge - Cervicovaginal ancillary only( [place])  3. Need for Tdap vaccination - Tdap vaccine greater than or equal to 7yo IM  4. Supervision of other normal pregnancy, antepartum FHT normal PHQ9 and GAD screening done - both low scores  5. Fetal echogenic intracardiac focus on prenatal ultrasound  6. History of gestational hypertension ASA 81mg  BP stable  Preterm labor symptoms and general obstetric precautions including but not limited to vaginal bleeding, contractions, leaking of fluid and fetal movement were reviewed in detail with the patient. Please refer to After Visit Summary for other counseling recommendations.   No follow-ups on file.  Future Appointments  Date Time Provider Department Center  05/21/2024  9:35 AM Arilyn Brierley J, DO [facility] None  06/04/2024  9:35 AM Barbra,  Rhea Thrun J, DO [facility] None  06/18/2024  9:35 AM Abigail, Rollo DASEN, MD [facility] None  07/01/2024  9:35 AM Mida Cory J, DO [facility] None  07/08/2024  9:35 AM Theordore Cisnero J, DO [facility] None    Synia Douglass J Cadden Elizondo, DO

## 2024-05-08 LAB — CBC
Hematocrit: 27.6 % — ABNORMAL LOW (ref 34.0–46.6)
Hemoglobin: 8.9 g/dL — ABNORMAL LOW (ref 11.1–15.9)
MCH: 27.3 pg (ref 26.6–33.0)
MCHC: 32.2 g/dL (ref 31.5–35.7)
MCV: 85 fL (ref 79–97)
Platelets: 316 x10E3/uL (ref 150–450)
RBC: 3.26 x10E6/uL — ABNORMAL LOW (ref 3.77–5.28)
RDW: 13 % (ref 11.7–15.4)
WBC: 7.1 x10E3/uL (ref 3.4–10.8)

## 2024-05-08 LAB — GLUCOSE TOLERANCE, 2 HOURS W/ 1HR
Glucose, 1 hour: 129 mg/dL (ref 70–179)
Glucose, 2 hour: 100 mg/dL (ref 70–152)
Glucose, Fasting: 72 mg/dL (ref 70–91)

## 2024-05-08 LAB — HIV ANTIBODY (ROUTINE TESTING W REFLEX): HIV Screen 4th Generation wRfx: NONREACTIVE

## 2024-05-08 LAB — CERVICOVAGINAL ANCILLARY ONLY
Bacterial Vaginitis (gardnerella): POSITIVE — AB
Candida Glabrata: NEGATIVE
Candida Vaginitis: POSITIVE — AB
Comment: NEGATIVE
Comment: NEGATIVE
Comment: NEGATIVE

## 2024-05-08 LAB — RPR: RPR Ser Ql: NONREACTIVE

## 2024-05-11 ENCOUNTER — Ambulatory Visit: Payer: Self-pay | Admitting: Family Medicine

## 2024-05-11 ENCOUNTER — Encounter: Admitting: Obstetrics and Gynecology

## 2024-05-11 DIAGNOSIS — D509 Iron deficiency anemia, unspecified: Secondary | ICD-10-CM | POA: Insufficient documentation

## 2024-05-11 MED ORDER — FLUCONAZOLE 150 MG PO TABS: 150.0000 mg | ORAL_TABLET | Freq: Once | ORAL | 0 refills | Status: AC

## 2024-05-11 MED ORDER — METRONIDAZOLE 500 MG PO TABS: 500.0000 mg | ORAL_TABLET | Freq: Two times a day (BID) | ORAL | 0 refills | Status: DC

## 2024-05-12 ENCOUNTER — Telehealth: Payer: Self-pay

## 2024-05-12 NOTE — Telephone Encounter (Signed)
 Dr. Barbra, patient will be scheduled as soon as possible.  Auth Submission: NO AUTH NEEDED Site of care: Site of care: [facility] Payer: [employer] medicaid Medication & CPT/J Code(s) submitted: Venofer (Iron Sucrose) J1756 Diagnosis Code:  Route of submission (phone, fax, portal):  Phone # Fax # Auth type: Buy/Bill PB Units/visits requested: 200mg  x 5 doses Reference number:  Approval from: 05/12/24 to 07/01/24

## 2024-05-19 ENCOUNTER — Ambulatory Visit

## 2024-05-21 ENCOUNTER — Ambulatory Visit: Admitting: Family Medicine

## 2024-05-21 VITALS — BP 113/59 | HR 87 | Wt 137.0 lb

## 2024-05-21 DIAGNOSIS — Z3480 Encounter for supervision of other normal pregnancy, unspecified trimester: Secondary | ICD-10-CM

## 2024-05-21 DIAGNOSIS — Z8759 Personal history of other complications of pregnancy, childbirth and the puerperium: Secondary | ICD-10-CM

## 2024-05-21 DIAGNOSIS — Z3A30 30 weeks gestation of pregnancy: Secondary | ICD-10-CM | POA: Diagnosis not present

## 2024-05-21 DIAGNOSIS — O283 Abnormal ultrasonic finding on antenatal screening of mother: Secondary | ICD-10-CM | POA: Diagnosis not present

## 2024-05-21 DIAGNOSIS — D509 Iron deficiency anemia, unspecified: Secondary | ICD-10-CM

## 2024-05-21 DIAGNOSIS — O99019 Anemia complicating pregnancy, unspecified trimester: Secondary | ICD-10-CM

## 2024-05-21 NOTE — Progress Notes (Signed)
 PRENATAL VISIT NOTE  Subjective:  Michelle Calhoun is a 28 y.o. G3P2002 at [redacted]w[redacted]d being seen today for ongoing prenatal care.  She is currently monitored for the following issues for this high-risk pregnancy and has Supervision of other normal pregnancy, antepartum; Sickle cell trait; Fetal echogenic intracardiac focus on prenatal ultrasound; History of gestational hypertension; Asthma affecting pregnancy in second trimester; and Iron  deficiency anemia during pregnancy on their problem list.  Patient reports no complaints.  Contractions: Irritability. Vag. Bleeding: None.  Movement: Present. Denies leaking of fluid.   The following portions of the patient's history were reviewed and updated as appropriate: allergies, current medications, past family history, past medical history, past social history, past surgical history and problem list.   Objective:   Vitals:   05/21/24 0934  BP: (!) 113/59  Pulse: 87  Weight: 137 lb (62.1 kg)    Fetal Status:  Fetal Heart Rate (bpm): 144   Movement: Present    General: Alert, oriented and cooperative. Patient is in no acute distress.  Skin: Skin is warm and dry. No rash noted.   Cardiovascular: Normal heart rate noted  Respiratory: Normal respiratory effort, no problems with respiration noted  Abdomen: Soft, gravid, appropriate for gestational age.  Pain/Pressure: Present     Pelvic: Cervical exam deferred        Extremities: Normal range of motion.  Edema: None  Mental Status: Normal mood and affect. Normal behavior. Normal judgment and thought content.      05/07/2024    8:34 AM 01/15/2024    8:54 AM 07/13/2015   12:52 PM  Depression screen PHQ 2/9  Decreased Interest 3 1 0  Down, Depressed, Hopeless 0 3 0  PHQ - 2 Score 3 4 0  Altered sleeping 0 2   Tired, decreased energy 1 3   Change in appetite 0 3   Feeling bad or failure about yourself  0 3   Trouble concentrating 0 2   Moving slowly or fidgety/restless 0 0   Suicidal  thoughts 0 0   PHQ-9 Score 4  17       Data saved with a previous flowsheet row definition        05/07/2024    8:34 AM 01/15/2024    8:55 AM  GAD 7 : Generalized Anxiety Score  Nervous, Anxious, on Edge 0 1  Control/stop worrying 0 3  Worry too much - different things 0 3  Trouble relaxing 0 3  Restless 0 0  Easily annoyed or irritable 0 1  Afraid - awful might happen 0 2  Total GAD 7 Score 0 13    Assessment and Plan:  Pregnancy: G3P2002 at [redacted]w[redacted]d 1. [redacted] weeks gestation of pregnancy (Primary)  2. Supervision of other normal pregnancy, antepartum FHT normal  3. History of gestational hypertension BP normal  4. Fetal echogenic intracardiac focus on prenatal ultrasound Serial US   5. Iron  deficiency anemia during pregnancy Scheduled for iron  infusions  Preterm labor symptoms and general obstetric precautions including but not limited to vaginal bleeding, contractions, leaking of fluid and fetal movement were reviewed in detail with the patient. Please refer to After Visit Summary for other counseling recommendations.   No follow-ups on file.  Future Appointments  Date Time Provider Department Center  05/27/2024  8:15 AM [facility] None  06/01/2024  8:15 AM [facility] None  06/02/2024  8:15 AM Sharpe, Cquadayshia L, LCSWA [facility] None  06/04/2024  9:35 AM Barbra, Osmani Kersten J,  DO [facility] None  06/18/2024  9:35 AM Dunn, Rollo DASEN, MD [facility] None  07/01/2024  9:35 AM Tyre Beaver J, DO [facility] None  07/08/2024  9:35 AM Mckaila Duffus J, DO [facility] None    Klohe Lovering J Auri Jahnke, DO

## 2024-05-22 ENCOUNTER — Ambulatory Visit

## 2024-05-25 ENCOUNTER — Ambulatory Visit

## 2024-05-27 ENCOUNTER — Ambulatory Visit

## 2024-05-27 MED ORDER — IRON SUCROSE 200 MG IVPB - SIMPLE MED: 200.0000 mg | Freq: Once | Status: DC

## 2024-06-01 ENCOUNTER — Ambulatory Visit (INDEPENDENT_AMBULATORY_CARE_PROVIDER_SITE_OTHER)

## 2024-06-01 VITALS — BP 108/70 | HR 109 | Temp 98.4°F | Resp 16 | Ht 63.0 in | Wt 144.4 lb

## 2024-06-01 DIAGNOSIS — O99019 Anemia complicating pregnancy, unspecified trimester: Secondary | ICD-10-CM

## 2024-06-01 DIAGNOSIS — D509 Iron deficiency anemia, unspecified: Secondary | ICD-10-CM

## 2024-06-01 DIAGNOSIS — Z3A32 32 weeks gestation of pregnancy: Secondary | ICD-10-CM

## 2024-06-01 MED ORDER — IRON SUCROSE 200 MG IVPB - SIMPLE MED
200.0000 mg | Freq: Once | Status: AC
  Administered 2024-06-01: 200 mg via INTRAVENOUS

## 2024-06-01 NOTE — Progress Notes (Signed)
 Diagnosis: Iron  Deficiency Anemia  Provider:  Mannam, Praveen MD  Procedure: IV Infusion  IV Type: Peripheral, IV Location: L Antecubital  Venofer  (Iron  Sucrose), Dose: 200 mg  Infusion Start Time: 9167  Infusion Stop Time: 0847  Post Infusion IV Care: Observation period completed and Peripheral IV Discontinued  Discharge: Condition: Good, Destination: Home . AVS Declined  Performed by:  Rocky FORBES Sar, RN

## 2024-06-02 ENCOUNTER — Encounter: Admitting: Licensed Clinical Social Worker

## 2024-06-04 ENCOUNTER — Telehealth: Admitting: Family Medicine

## 2024-06-04 VITALS — Wt 136.0 lb

## 2024-06-04 DIAGNOSIS — Z8759 Personal history of other complications of pregnancy, childbirth and the puerperium: Secondary | ICD-10-CM | POA: Diagnosis not present

## 2024-06-04 DIAGNOSIS — O283 Abnormal ultrasonic finding on antenatal screening of mother: Secondary | ICD-10-CM | POA: Diagnosis not present

## 2024-06-04 DIAGNOSIS — D509 Iron deficiency anemia, unspecified: Secondary | ICD-10-CM

## 2024-06-04 DIAGNOSIS — D573 Sickle-cell trait: Secondary | ICD-10-CM

## 2024-06-04 DIAGNOSIS — Z3A32 32 weeks gestation of pregnancy: Secondary | ICD-10-CM

## 2024-06-04 DIAGNOSIS — Z3480 Encounter for supervision of other normal pregnancy, unspecified trimester: Secondary | ICD-10-CM

## 2024-06-04 DIAGNOSIS — O99019 Anemia complicating pregnancy, unspecified trimester: Secondary | ICD-10-CM | POA: Diagnosis not present

## 2024-06-04 MED ORDER — ASPIRIN 81 MG PO TBEC: 81.0000 mg | DELAYED_RELEASE_TABLET | Freq: Every day | ORAL | 2 refills | Status: DC

## 2024-06-04 MED ORDER — FLUCONAZOLE 150 MG PO TABS: 150.0000 mg | ORAL_TABLET | Freq: Once | ORAL | 0 refills | Status: AC

## 2024-06-04 NOTE — Progress Notes (Signed)
 PRENATAL VISIT NOTE  Subjective:  Michelle Calhoun is a 28 y.o. G3P2002 at [redacted]w[redacted]d being seen today for ongoing prenatal care.  She is currently monitored for the following issues for this low-risk pregnancy and has Supervision of other normal pregnancy, antepartum; Sickle cell trait; Fetal echogenic intracardiac focus on prenatal ultrasound; History of gestational hypertension; Asthma affecting pregnancy in second trimester; and Iron  deficiency anemia during pregnancy on their problem list.  Patient reports occasional contractions.  Contractions: Irritability. Vag. Bleeding: None.  Movement: Present. Denies leaking of fluid.   The following portions of the patient's history were reviewed and updated as appropriate: allergies, current medications, past family history, past medical history, past social history, past surgical history and problem list.   Objective:   Vitals:   06/04/24 0933  Weight: 136 lb (61.7 kg)    Fetal Status:      Movement: Present    General: Alert, oriented and cooperative. Patient is in no acute distress.  Skin: Skin is warm and dry. No rash noted.   Cardiovascular: Normal heart rate noted  Respiratory: Normal respiratory effort, no problems with respiration noted  Abdomen: Soft, gravid, appropriate for gestational age.  Pain/Pressure: Present     Pelvic: Cervical exam deferred        Extremities: Normal range of motion.  Edema: Trace  Mental Status: Normal mood and affect. Normal behavior. Normal judgment and thought content.      05/07/2024    8:34 AM 01/15/2024    8:54 AM 07/13/2015   12:52 PM  Depression screen PHQ 2/9  Decreased Interest 3 1 0  Down, Depressed, Hopeless 0 3 0  PHQ - 2 Score 3 4 0  Altered sleeping 0 2   Tired, decreased energy 1 3   Change in appetite 0 3   Feeling bad or failure about yourself  0 3   Trouble concentrating 0 2   Moving slowly or fidgety/restless 0 0   Suicidal thoughts 0 0   PHQ-9 Score 4  17       Data  saved with a previous flowsheet row definition        05/07/2024    8:34 AM 01/15/2024    8:55 AM  GAD 7 : Generalized Anxiety Score  Nervous, Anxious, on Edge 0 1  Control/stop worrying 0 3  Worry too much - different things 0 3  Trouble relaxing 0 3  Restless 0 0  Easily annoyed or irritable 0 1  Afraid - awful might happen 0 2  Total GAD 7 Score 0 13    Assessment and Plan:  Pregnancy: G3P2002 at [redacted]w[redacted]d 1. Supervision of other normal pregnancy, antepartum (Primary) FHT normal  2. Sickle cell trait FOB tested  3. Fetal echogenic intracardiac focus on prenatal ultrasound  4. Iron  deficiency anemia during pregnancy Getting iron  infusions  5. History of gestational hypertension Reminded her about taking ASA 81mg    Preterm labor symptoms and general obstetric precautions including but not limited to vaginal bleeding, contractions, leaking of fluid and fetal movement were reviewed in detail with the patient. Please refer to After Visit Summary for other counseling recommendations.   No follow-ups on file.  Future Appointments  Date Time Provider Department Center  06/05/2024  8:30 AM [facility] None  06/18/2024  9:35 AM Abigail Rollo DASEN, MD [facility] None  07/01/2024  9:35 AM Mcclain Shall J, DO [facility] None  07/08/2024  9:35 AM Barbra Mikela Senn J, DO [facility] None  Jenevieve Kirschbaum J Shephanie Romas, DO

## 2024-06-05 ENCOUNTER — Ambulatory Visit

## 2024-06-08 ENCOUNTER — Ambulatory Visit

## 2024-06-08 MED ORDER — IRON SUCROSE 200 MG IVPB - SIMPLE MED: 200.0000 mg | Freq: Once | Status: DC

## 2024-06-09 ENCOUNTER — Encounter: Payer: Self-pay | Admitting: Family Medicine

## 2024-06-18 ENCOUNTER — Encounter: Admitting: Obstetrics and Gynecology

## 2024-07-01 ENCOUNTER — Ambulatory Visit: Admitting: Family Medicine

## 2024-07-01 ENCOUNTER — Other Ambulatory Visit (HOSPITAL_COMMUNITY)
Admission: RE | Admit: 2024-07-01 | Discharge: 2024-07-01 | Disposition: A | Discharge status: Home / Self Care | Source: Ambulatory Visit | Attending: Family Medicine | Admitting: Family Medicine

## 2024-07-01 VITALS — BP 136/82 | HR 90 | Wt 145.0 lb

## 2024-07-01 DIAGNOSIS — Z3A36 36 weeks gestation of pregnancy: Secondary | ICD-10-CM | POA: Insufficient documentation

## 2024-07-01 DIAGNOSIS — O26893 Other specified pregnancy related conditions, third trimester: Secondary | ICD-10-CM | POA: Diagnosis present

## 2024-07-01 DIAGNOSIS — O283 Abnormal ultrasonic finding on antenatal screening of mother: Secondary | ICD-10-CM

## 2024-07-01 DIAGNOSIS — N898 Other specified noninflammatory disorders of vagina: Secondary | ICD-10-CM | POA: Diagnosis not present

## 2024-07-01 DIAGNOSIS — O99019 Anemia complicating pregnancy, unspecified trimester: Secondary | ICD-10-CM | POA: Diagnosis not present

## 2024-07-01 DIAGNOSIS — Z3480 Encounter for supervision of other normal pregnancy, unspecified trimester: Secondary | ICD-10-CM

## 2024-07-01 DIAGNOSIS — Z8759 Personal history of other complications of pregnancy, childbirth and the puerperium: Secondary | ICD-10-CM | POA: Diagnosis not present

## 2024-07-01 DIAGNOSIS — D509 Iron deficiency anemia, unspecified: Secondary | ICD-10-CM

## 2024-07-01 NOTE — Progress Notes (Signed)
 "  PRENATAL VISIT NOTE  Subjective:  Michelle Calhoun is a 28 y.o. G3P2002 at [redacted]w[redacted]d being seen today for ongoing prenatal care.  She is currently monitored for the following issues for this low-risk pregnancy and has Supervision of other normal pregnancy, antepartum; Sickle cell trait; Fetal echogenic intracardiac focus on prenatal ultrasound; History of gestational hypertension; Asthma affecting pregnancy in second trimester; and Iron  deficiency anemia during pregnancy on their problem list.  Patient reports pelvic pressure with occasional pain as the day goes on. Pain gets worse the longer she is on her feet.  Contractions: Irregular. Vag. Bleeding: None.  Movement: Present. Denies leaking of fluid.   The following portions of the patient's history were reviewed and updated as appropriate: allergies, current medications, past family history, past medical history, past social history, past surgical history and problem list.   Objective:   Vitals:   07/01/24 0935  BP: 136/82  Pulse: 90  Weight: 145 lb (65.8 kg)    Fetal Status:  Fetal Heart Rate (bpm): 135   Movement: Present    General: Alert, oriented and cooperative. Patient is in no acute distress.  Skin: Skin is warm and dry. No rash noted.   Cardiovascular: Normal heart rate noted  Respiratory: Normal respiratory effort, no problems with respiration noted  Abdomen: Soft, gravid, appropriate for gestational age.  Pain/Pressure: Present     Pelvic: Cervical exam performed in the presence of a chaperone        Extremities: Normal range of motion.  Edema: Trace  Mental Status: Normal mood and affect. Normal behavior. Normal judgment and thought content.      05/07/2024    8:34 AM 01/15/2024    8:54 AM 07/13/2015   12:52 PM  Depression screen PHQ 2/9  Decreased Interest 3 1 0  Down, Depressed, Hopeless 0 3 0  PHQ - 2 Score 3 4 0  Altered sleeping 0 2   Tired, decreased energy 1 3   Change in appetite 0 3   Feeling bad or  failure about yourself  0 3   Trouble concentrating 0 2   Moving slowly or fidgety/restless 0 0   Suicidal thoughts 0 0   PHQ-9 Score 4  17       Data saved with a previous flowsheet row definition        05/07/2024    8:34 AM 01/15/2024    8:55 AM  GAD 7 : Generalized Anxiety Score  Nervous, Anxious, on Edge 0 1  Control/stop worrying 0 3  Worry too much - different things 0 3  Trouble relaxing 0 3  Restless 0 0  Easily annoyed or irritable 0 1  Afraid - awful might happen 0 2  Total GAD 7 Score 0 13    Assessment and Plan:  Pregnancy: G3P2002 at [redacted]w[redacted]d 1. [redacted] weeks gestation of pregnancy (Primary) - Cervicovaginal ancillary only - Strep Gp B NAA  2. Supervision of other normal pregnancy, antepartum FHT normal Recommended abd support band to help with pelvic pressure  3. History of gestational hypertension BP creeping up. Will continue to watch Taking baby ASA  4. Iron  deficiency anemia during pregnancy On iron   5. Fetal echogenic intracardiac focus on prenatal ultrasound  6. Vaginal discharge - Cervicovaginal ancillary only  Preterm labor symptoms and general obstetric precautions including but not limited to vaginal bleeding, contractions, leaking of fluid and fetal movement were reviewed in detail with the patient. Please refer to After Visit Summary for other  counseling recommendations.   No follow-ups on file.  Future Appointments  Date Time Provider Department Center  07/03/2024  8:15 AM [facility] None  07/08/2024  9:35 AM Xylina Rhoads J, DO [facility] None  07/15/2024  8:15 AM Mahir Prabhakar J, DO [facility] None  07/23/2024 10:55 AM Barbra Kolyn Rozario J, DO [facility] None    Nahun Kronberg J Laddie Naeem, DO "

## 2024-07-02 NOTE — L&D Delivery Note (Signed)
 Michelle DREESE is a 29 y.o. female G73P2002 with IUP at [redacted]w[redacted]d admitted for active labor .  She progressed without augmentation to complete and delivered quickly with RN in the room.  Cord clamping delayed by several minutes then clamped by CNM and cut by CNM.  Placenta intact and spontaneous, bleeding minimal.   Intact perineum.   Mom and baby stable prior to transfer to postpartum. She plans on breastfeeding. She requests depo-provera  for birth control.   Delivery Note At 9:04 PM a viable and healthy female was delivered via Vaginal, Spontaneous.  APGAR: 9, 9; weight pending.   Placenta status: Spontaneous, Intact.  Cord: 3 vessels with the following complications: None.  Anesthesia: Epidural Episiotomy: None Lacerations: None Suture Repair: None Est. Blood Loss (mL):  158  Mom to postpartum.  Baby to Couplet care / Skin to Skin.  Tawni SAUNDERS Ammarie Matsuura 07/18/2024, 9:56 PM

## 2024-07-03 ENCOUNTER — Ambulatory Visit: Admitting: *Deleted

## 2024-07-03 ENCOUNTER — Ambulatory Visit: Payer: Self-pay | Admitting: Family Medicine

## 2024-07-03 VITALS — BP 107/66 | HR 88 | Temp 98.2°F | Resp 14 | Ht 63.0 in | Wt 146.0 lb

## 2024-07-03 DIAGNOSIS — Z3480 Encounter for supervision of other normal pregnancy, unspecified trimester: Secondary | ICD-10-CM

## 2024-07-03 DIAGNOSIS — O99019 Anemia complicating pregnancy, unspecified trimester: Secondary | ICD-10-CM

## 2024-07-03 DIAGNOSIS — Z3A36 36 weeks gestation of pregnancy: Secondary | ICD-10-CM | POA: Diagnosis not present

## 2024-07-03 DIAGNOSIS — O99820 Streptococcus B carrier state complicating pregnancy: Secondary | ICD-10-CM | POA: Insufficient documentation

## 2024-07-03 DIAGNOSIS — D509 Iron deficiency anemia, unspecified: Secondary | ICD-10-CM | POA: Diagnosis not present

## 2024-07-03 LAB — CERVICOVAGINAL ANCILLARY ONLY
Bacterial Vaginitis (gardnerella): POSITIVE — AB
Candida Glabrata: NEGATIVE
Candida Vaginitis: POSITIVE — AB
Chlamydia: NEGATIVE
Comment: NEGATIVE
Comment: NEGATIVE
Comment: NEGATIVE
Comment: NEGATIVE
Comment: NORMAL
Neisseria Gonorrhea: NEGATIVE

## 2024-07-03 LAB — STREP GP B NAA: Strep Gp B NAA: POSITIVE — AB

## 2024-07-03 MED ORDER — FLUCONAZOLE 150 MG PO TABS: 150.0000 mg | ORAL_TABLET | Freq: Once | ORAL | 0 refills | Status: AC

## 2024-07-03 MED ORDER — IRON SUCROSE 200 MG IVPB - SIMPLE MED
200.0000 mg | Freq: Once | Status: AC
  Administered 2024-07-03: 200 mg via INTRAVENOUS

## 2024-07-03 MED ORDER — METRONIDAZOLE 500 MG PO TABS: 500.0000 mg | ORAL_TABLET | Freq: Two times a day (BID) | ORAL | 0 refills | Status: DC

## 2024-07-03 NOTE — Progress Notes (Signed)
 Diagnosis:  Iron  Deficiency Anemia  Provider:  Mannam, Praveen MD  Procedure: IV Infusion  IV Type: Peripheral, IV Location: L Antecubital   Venofer  (Iron  Sucrose), Dose: 200 mg  Infusion Start Time: 0834 am  Infusion Stop Time: 0856 am  Post Infusion IV Care: Observation period completed and Peripheral IV Discontinued  Discharge: Condition: Good, Destination: Home . AVS Declined  Performed by:  Trudy Lamarr LABOR, RN

## 2024-07-06 ENCOUNTER — Ambulatory Visit

## 2024-07-06 ENCOUNTER — Inpatient Hospital Stay (HOSPITAL_COMMUNITY)
Admission: AD | Admit: 2024-07-06 | Discharge: 2024-07-07 | Disposition: A | Discharge status: Home / Self Care | Attending: Obstetrics & Gynecology | Admitting: Obstetrics & Gynecology

## 2024-07-06 VITALS — BP 111/65 | HR 85 | Temp 98.1°F | Resp 16 | Ht 63.0 in | Wt 147.0 lb

## 2024-07-06 DIAGNOSIS — Z3A37 37 weeks gestation of pregnancy: Secondary | ICD-10-CM | POA: Insufficient documentation

## 2024-07-06 DIAGNOSIS — O479 False labor, unspecified: Secondary | ICD-10-CM

## 2024-07-06 DIAGNOSIS — O10913 Unspecified pre-existing hypertension complicating pregnancy, third trimester: Secondary | ICD-10-CM | POA: Insufficient documentation

## 2024-07-06 DIAGNOSIS — O99019 Anemia complicating pregnancy, unspecified trimester: Secondary | ICD-10-CM

## 2024-07-06 DIAGNOSIS — O99013 Anemia complicating pregnancy, third trimester: Secondary | ICD-10-CM | POA: Insufficient documentation

## 2024-07-06 DIAGNOSIS — O99343 Other mental disorders complicating pregnancy, third trimester: Secondary | ICD-10-CM | POA: Insufficient documentation

## 2024-07-06 DIAGNOSIS — D509 Iron deficiency anemia, unspecified: Secondary | ICD-10-CM | POA: Insufficient documentation

## 2024-07-06 DIAGNOSIS — O99513 Diseases of the respiratory system complicating pregnancy, third trimester: Secondary | ICD-10-CM | POA: Insufficient documentation

## 2024-07-06 DIAGNOSIS — Z79899 Other long term (current) drug therapy: Secondary | ICD-10-CM | POA: Insufficient documentation

## 2024-07-06 DIAGNOSIS — O471 False labor at or after 37 completed weeks of gestation: Secondary | ICD-10-CM | POA: Insufficient documentation

## 2024-07-06 MED ORDER — IRON SUCROSE 200 MG IVPB - SIMPLE MED
200.0000 mg | Freq: Once | Status: AC
  Administered 2024-07-06: 200 mg via INTRAVENOUS
  Filled 2024-07-06: qty 110

## 2024-07-06 NOTE — Progress Notes (Signed)
 Diagnosis: Iron  Deficiency Anemia  Provider:  Praveen Mannam MD  Procedure: IV Infusion  IV Type: Peripheral, IV Location: L Antecubital  Venofer  (Iron  Sucrose), Dose: 200 mg  Infusion Start Time: 0827  Infusion Stop Time: 9157  Post Infusion IV Care: Observation period completed and Peripheral IV Discontinued  Discharge: Condition: Good, Destination: Home . AVS Declined  Performed by:  Charlissa Petros, RN

## 2024-07-07 ENCOUNTER — Encounter (HOSPITAL_COMMUNITY): Payer: Self-pay | Admitting: Obstetrics & Gynecology

## 2024-07-07 DIAGNOSIS — O479 False labor, unspecified: Secondary | ICD-10-CM

## 2024-07-07 DIAGNOSIS — Z3A37 37 weeks gestation of pregnancy: Secondary | ICD-10-CM

## 2024-07-07 DIAGNOSIS — D509 Iron deficiency anemia, unspecified: Secondary | ICD-10-CM | POA: Diagnosis present

## 2024-07-07 DIAGNOSIS — O99013 Anemia complicating pregnancy, third trimester: Secondary | ICD-10-CM | POA: Diagnosis present

## 2024-07-07 DIAGNOSIS — O99343 Other mental disorders complicating pregnancy, third trimester: Secondary | ICD-10-CM | POA: Diagnosis not present

## 2024-07-07 DIAGNOSIS — O99513 Diseases of the respiratory system complicating pregnancy, third trimester: Secondary | ICD-10-CM | POA: Diagnosis not present

## 2024-07-07 DIAGNOSIS — O471 False labor at or after 37 completed weeks of gestation: Secondary | ICD-10-CM | POA: Diagnosis not present

## 2024-07-07 DIAGNOSIS — O10913 Unspecified pre-existing hypertension complicating pregnancy, third trimester: Secondary | ICD-10-CM | POA: Diagnosis not present

## 2024-07-07 DIAGNOSIS — Z79899 Other long term (current) drug therapy: Secondary | ICD-10-CM | POA: Diagnosis not present

## 2024-07-07 LAB — POCT FERN TEST: POCT Fern Test: NEGATIVE

## 2024-07-07 MED ORDER — FLUCONAZOLE 150 MG PO TABS: 150.0000 mg | ORAL_TABLET | Freq: Every day | ORAL | 0 refills | Status: DC

## 2024-07-07 NOTE — MAU Provider Note (Signed)
 " History     [id number]  Arrival date and time: 07/06/24 2352    Chief Complaint  Patient presents with   Rupture of Membranes     HPI Michelle Calhoun is a 29 y.o. at [redacted]w[redacted]d who presents for LOF. She was asleep and woke up with fluid coming out of her vagina. She was recently diagnosed with BV and yeast on 07/05/24. She was given a dose of diflucan  and started on 7 day course of metronidazole . She has not continued to leak. Denies VB. Endorses contractions. Endorses good FM.    A/Positive/-- (07/01 9071)  Past Medical History:  Diagnosis Date   Asthma    Hx of chlamydia infection    Hypertension    Vaginal Pap smear, abnormal     Past Surgical History:  Procedure Laterality Date   NO PAST SURGERIES      Family History  Problem Relation Age of Onset   Healthy Mother    Healthy Father    Asthma Brother    Hypertension Maternal Grandmother    Alcohol abuse Neg Hx    Arthritis Neg Hx    Birth defects Neg Hx    Cancer Neg Hx    COPD Neg Hx    Depression Neg Hx    Diabetes Neg Hx    Drug abuse Neg Hx    Early death Neg Hx    Hearing loss Neg Hx    Hyperlipidemia Neg Hx    Heart disease Neg Hx    Kidney disease Neg Hx    Learning disabilities Neg Hx    Mental illness Neg Hx    Mental retardation Neg Hx    Miscarriages / Stillbirths Neg Hx    Stroke Neg Hx    Vision loss Neg Hx    Varicose Veins Neg Hx     Social History   Socioeconomic History   Marital status: Married    Spouse name: Janae Code   Number of children: 2   Years of education: Not on file   Highest education level: Not on file  Occupational History   Not on file  Tobacco Use   Smoking status: Never   Smokeless tobacco: Never  Vaping Use   Vaping status: Never Used  Substance and Sexual Activity   Alcohol use: Not Currently   Drug use: No   Sexual activity: Yes    Comment: patient reports last IC 09/13/22  Other Topics Concern   Not on file  Social History Narrative   Not  on file   Social Drivers of Health   Tobacco Use: Low Risk (04/11/2024)   Patient History    Smoking Tobacco Use: Never    Smokeless Tobacco Use: Never    Passive Exposure: Not on file  Financial Resource Strain: Not on file  Food Insecurity: No Food Insecurity (09/16/2022)   Hunger Vital Sign    Worried About Running Out of Food in the Last Year: Never true    Ran Out of Food in the Last Year: Never true  Transportation Needs: No Transportation Needs (09/16/2022)   PRAPARE - Administrator, Civil Service (Medical): No    Lack of Transportation (Non-Medical): No  Physical Activity: Not on file  Stress: Not on file  Social Connections: Not on file  Intimate Partner Violence: Not At Risk (09/16/2022)   Humiliation, Afraid, Rape, and Kick questionnaire    Fear of Current or Ex-Partner: No    Emotionally Abused: No  Physically Abused: No    Sexually Abused: No  Depression (PHQ2-9): Low Risk (05/07/2024)   Depression (PHQ2-9)    PHQ-2 Score: 4  Alcohol Screen: Not on file  Housing: Low Risk (09/16/2022)   Housing    Last Housing Risk Score: 0  Utilities: Not At Risk (09/16/2022)   AHC Utilities    Threatened with loss of utilities: No  Health Literacy: Not on file    Allergies[1]  Medications Ordered Prior to Encounter[2]  Pertinent positives and negative per HPI, all others reviewed and negative  Physical Exam   BP 117/68 (BP Location: Right Arm)   Pulse 78   Temp 97.8 F (36.6 C) (Oral)   Ht 5' 3 (1.6 m)   Wt 66 kg   LMP 10/21/2023   BMI 25.77 kg/m   Patient Vitals for the past 24 hrs:  BP Temp Temp src Pulse Height Weight  07/07/24 0004 117/68 97.8 F (36.6 C) Oral 78 5' 3 (1.6 m) 66 kg    Physical Exam Vitals and nursing note reviewed. Exam conducted with a chaperone present.  Constitutional:      Appearance: She is well-developed.  HENT:     Head: Normocephalic and atraumatic.     Mouth/Throat:     Mouth: Mucous membranes are moist.   Eyes:     Extraocular Movements: Extraocular movements intact.  Cardiovascular:     Rate and Rhythm: Normal rate and regular rhythm.  Pulmonary:     Effort: Pulmonary effort is normal.  Abdominal:     Palpations: Abdomen is soft.     Tenderness: There is no abdominal tenderness.  Genitourinary:    Comments: White vaginal discharge, no pooling of fluid. Skin:    Capillary Refill: Capillary refill takes less than 2 seconds.  Neurological:     General: No focal deficit present.     Mental Status: She is alert.      Cervical Exam Dilation: 1.5 Effacement (%): 30 Cervical Position: Middle Station: -2 Presentation: Vertex Exam by:: Sanseverino, RN   FHT Baseline: 135 bpm Variability: Good {> 6 bpm) Accelerations: Reactive Decelerations: Absent Uterine activity: Frequency: Every 4 minutes  Labs Results for orders placed or performed during the hospital encounter of 07/06/24 (from the past 24 hours)  POCT fern test     Status: None   Collection Time: 07/07/24 12:41 AM  Result Value Ref Range   POCT Fern Test Negative = intact amniotic membranes     Imaging No results found.  MAU Course  Procedures Lab Orders         POCT fern test    No orders of the defined types were placed in this encounter.  Imaging Orders  No imaging studies ordered today    MDM Moderate (Level 3-4)  Assessment and Plan  Iron  deficiency anemia during pregnancy  False labor  [redacted] weeks gestation of pregnancy   #FWB: NST: Reactive  Michelle Calhoun is a 29 y.o. at [redacted]w[redacted]d who presents for LOF.  -Likely uterine irritation in setting of confirmed BV/yeast vs bladder compression in setting of [redacted] weeks gestation of pregnancy -Fern negative, no pooling on SSE -Already on metronidazole  and had taken one dose of Diflucan . -Contracting regularly on the monitor but no cervical change noted and patient is not uncomfortable.   -Stable for discharge home -Sent single dose of diflucan  to be  taken on last day of metronidazole  treatment. -All questions answered, anticipatory guidance and detailed return precautions provided.  Quillan Whitter L Jamai Dolce, MD/MHA 07/07/2024 1:31 AM  Allergies as of 07/07/2024   No Known Allergies      Medication List     TAKE these medications    aspirin  EC 81 MG tablet Take 1 tablet (81 mg total) by mouth at bedtime. Start taking when you are [redacted] weeks pregnant for rest of pregnancy for prevention of preeclampsia   metroNIDAZOLE  500 MG tablet Commonly known as: FLAGYL  Take 1 tablet (500 mg total) by mouth 2 (two) times daily.   prenatal multivitamin Tabs tablet Take 1 tablet by mouth daily at 12 noon.   sertraline  50 MG tablet Commonly known as: Zoloft  Take 0.5 tablets (25 mg total) by mouth daily.           [1] No Known Allergies [2]  Current Facility-Administered Medications on File Prior to Encounter  Medication Dose Route Frequency Provider Last Rate Last Admin   iron  sucrose (VENOFER ) 200 mg in sodium chloride  0.9 % 100 mL IVPB  200 mg Intravenous Once Stinson, Jacob J, DO       Current Outpatient Medications on File Prior to Encounter  Medication Sig Dispense Refill   aspirin  EC 81 MG tablet Take 1 tablet (81 mg total) by mouth at bedtime. Start taking when you are [redacted] weeks pregnant for rest of pregnancy for prevention of preeclampsia 300 tablet 2   metroNIDAZOLE  (FLAGYL ) 500 MG tablet Take 1 tablet (500 mg total) by mouth 2 (two) times daily. 14 tablet 0   Prenatal Vit-Fe Fumarate-FA (PRENATAL MULTIVITAMIN) TABS tablet Take 1 tablet by mouth daily at 12 noon.     sertraline  (ZOLOFT ) 50 MG tablet Take 0.5 tablets (25 mg total) by mouth daily. 30 tablet 6   [DISCONTINUED] amLODipine  (NORVASC ) 5 MG tablet Take 1 tablet (5 mg total) by mouth daily. 30 tablet 3   [DISCONTINUED] diphenhydrAMINE  (BENADRYL ) 25 MG tablet Take 25 mg by mouth every 6 (six) hours as needed for allergies or sleep.     [DISCONTINUED]  triamterene -hydrochlorothiazide  (DYAZIDE) 37.5-25 MG capsule Take 1 each (1 capsule total) by mouth daily. 30 capsule 1   "

## 2024-07-07 NOTE — MAU Note (Addendum)
 Pt says at 2315- - she was asleep- woke  with fluid coming out of her vag- clear . Thinks she still has fluid coming out.  [facility] office No VE Denies HSV GBS- positive  Feels baby moving Feels some mild UC's

## 2024-07-08 ENCOUNTER — Ambulatory Visit: Admitting: Family Medicine

## 2024-07-08 VITALS — BP 120/74 | HR 91 | Wt 142.0 lb

## 2024-07-08 DIAGNOSIS — O99820 Streptococcus B carrier state complicating pregnancy: Secondary | ICD-10-CM | POA: Diagnosis not present

## 2024-07-08 DIAGNOSIS — Z3480 Encounter for supervision of other normal pregnancy, unspecified trimester: Secondary | ICD-10-CM

## 2024-07-08 DIAGNOSIS — Z3A37 37 weeks gestation of pregnancy: Secondary | ICD-10-CM

## 2024-07-08 DIAGNOSIS — Z8759 Personal history of other complications of pregnancy, childbirth and the puerperium: Secondary | ICD-10-CM | POA: Diagnosis not present

## 2024-07-08 DIAGNOSIS — O283 Abnormal ultrasonic finding on antenatal screening of mother: Secondary | ICD-10-CM | POA: Diagnosis not present

## 2024-07-08 DIAGNOSIS — O99019 Anemia complicating pregnancy, unspecified trimester: Secondary | ICD-10-CM

## 2024-07-08 DIAGNOSIS — D509 Iron deficiency anemia, unspecified: Secondary | ICD-10-CM

## 2024-07-08 NOTE — Progress Notes (Signed)
 "  PRENATAL VISIT NOTE  Subjective:  Michelle Calhoun is a 29 y.o. G3P2002 at [redacted]w[redacted]d being seen today for ongoing prenatal care.  She is currently monitored for the following issues for this low-risk pregnancy and has Supervision of other normal pregnancy, antepartum; Sickle cell trait; Fetal echogenic intracardiac focus on prenatal ultrasound; History of gestational hypertension; Asthma affecting pregnancy in second trimester; Iron  deficiency anemia during pregnancy; and GBS (group B Streptococcus carrier), +RV culture, currently pregnant on their problem list.  Patient reports occasional contractions.  Contractions: Irregular. Vag. Bleeding: None.  Movement: Present. Denies leaking of fluid.   The following portions of the patient's history were reviewed and updated as appropriate: allergies, current medications, past family history, past medical history, past social history, past surgical history and problem list.   Objective:   Vitals:   07/08/24 0955  BP: 120/74  Pulse: 91  Weight: 142 lb (64.4 kg)    Fetal Status:  Fetal Heart Rate (bpm): 143 Fundal Height: 37 cm Movement: Present Presentation: Vertex  General: Alert, oriented and cooperative. Patient is in no acute distress.  Skin: Skin is warm and dry. No rash noted.   Cardiovascular: Normal heart rate noted  Respiratory: Normal respiratory effort, no problems with respiration noted  Abdomen: Soft, gravid, appropriate for gestational age.  Pain/Pressure: Present     Pelvic: Cervical exam performed in the presence of a chaperone Dilation: 3 Effacement (%): 20 Station: -2  Extremities: Normal range of motion.  Edema: None  Mental Status: Normal mood and affect. Normal behavior. Normal judgment and thought content.      05/07/2024    8:34 AM 01/15/2024    8:54 AM 07/13/2015   12:52 PM  Depression screen PHQ 2/9  Decreased Interest 3 1 0  Down, Depressed, Hopeless 0 3 0  PHQ - 2 Score 3 4 0  Altered sleeping 0 2   Tired,  decreased energy 1 3   Change in appetite 0 3   Feeling bad or failure about yourself  0 3   Trouble concentrating 0 2   Moving slowly or fidgety/restless 0 0   Suicidal thoughts 0 0   PHQ-9 Score 4  17       Data saved with a previous flowsheet row definition        05/07/2024    8:34 AM 01/15/2024    8:55 AM  GAD 7 : Generalized Anxiety Score  Nervous, Anxious, on Edge 0 1  Control/stop worrying 0 3  Worry too much - different things 0 3  Trouble relaxing 0 3  Restless 0 0  Easily annoyed or irritable 0 1  Afraid - awful might happen 0 2  Total GAD 7 Score 0 13    Assessment and Plan:  Pregnancy: G3P2002 at [redacted]w[redacted]d 1. [redacted] weeks gestation of pregnancy (Primary)  2. Supervision of other normal pregnancy, antepartum FHT normal  3. GBS (group B Streptococcus carrier), +RV culture, currently pregnant Intrapartum ppx  4. Iron  deficiency anemia during pregnancy On iron  infusions  5. Fetal echogenic intracardiac focus on prenatal ultrasound  6. History of gestational hypertension BP stable On ASA 81mg   Term labor symptoms and general obstetric precautions including but not limited to vaginal bleeding, contractions, leaking of fluid and fetal movement were reviewed in detail with the patient. Please refer to After Visit Summary for other counseling recommendations.   No follow-ups on file.  Future Appointments  Date Time Provider Department Center  07/09/2024  8:15 AM [facility]  2 [facility] None  07/13/2024  8:15 AM [facility] 5 [facility] None  07/15/2024  8:15 AM Dontrel Smethers J, DO [facility] None  07/23/2024 10:55 AM Barbra, Kylyn Sookram J, DO [facility] None    Yuji Walth J Ginnette Gates, DO "

## 2024-07-09 ENCOUNTER — Ambulatory Visit (INDEPENDENT_AMBULATORY_CARE_PROVIDER_SITE_OTHER)

## 2024-07-09 ENCOUNTER — Inpatient Hospital Stay (HOSPITAL_COMMUNITY)
Admission: AD | Admit: 2024-07-09 | Discharge: 2024-07-09 | Disposition: A | Discharge status: Home / Self Care | Attending: Obstetrics & Gynecology | Admitting: Obstetrics & Gynecology

## 2024-07-09 ENCOUNTER — Encounter (HOSPITAL_COMMUNITY): Payer: Self-pay | Admitting: Obstetrics & Gynecology

## 2024-07-09 VITALS — BP 111/68 | HR 76 | Temp 97.5°F | Resp 20 | Ht 63.0 in | Wt 144.4 lb

## 2024-07-09 DIAGNOSIS — Z3A37 37 weeks gestation of pregnancy: Secondary | ICD-10-CM | POA: Diagnosis not present

## 2024-07-09 DIAGNOSIS — O26893 Other specified pregnancy related conditions, third trimester: Secondary | ICD-10-CM | POA: Diagnosis not present

## 2024-07-09 DIAGNOSIS — D509 Iron deficiency anemia, unspecified: Secondary | ICD-10-CM | POA: Diagnosis not present

## 2024-07-09 DIAGNOSIS — O99019 Anemia complicating pregnancy, unspecified trimester: Secondary | ICD-10-CM | POA: Diagnosis not present

## 2024-07-09 DIAGNOSIS — Z0371 Encounter for suspected problem with amniotic cavity and membrane ruled out: Secondary | ICD-10-CM | POA: Diagnosis not present

## 2024-07-09 DIAGNOSIS — N898 Other specified noninflammatory disorders of vagina: Secondary | ICD-10-CM | POA: Insufficient documentation

## 2024-07-09 DIAGNOSIS — O471 False labor at or after 37 completed weeks of gestation: Secondary | ICD-10-CM | POA: Diagnosis present

## 2024-07-09 LAB — POCT FERN TEST: POCT Fern Test: NEGATIVE

## 2024-07-09 LAB — RUPTURE OF MEMBRANE (ROM)PLUS: Rom Plus: NEGATIVE

## 2024-07-09 MED ORDER — IRON SUCROSE 200 MG IVPB - SIMPLE MED
200.0000 mg | Freq: Once | Status: AC
  Administered 2024-07-09: 200 mg via INTRAVENOUS
  Filled 2024-07-09: qty 110

## 2024-07-09 MED ORDER — LACTATED RINGERS IV BOLUS
1000.0000 mL | Freq: Once | INTRAVENOUS | Status: AC
  Administered 2024-07-09: 1000 mL via INTRAVENOUS

## 2024-07-09 NOTE — Progress Notes (Signed)
 Diagnosis: Iron  Deficiency Anemia  Provider:  Praveen Mannam MD  Procedure: IV Infusion  IV Type: Peripheral, IV Location: L Antecubital  Venofer  (Iron  Sucrose), Dose: 200 mg  Infusion Start Time: 0819  Infusion Stop Time: 9161  Post Infusion IV Care: Patient declined observation and Peripheral IV Discontinued  Discharge: Condition: Good, Destination: Home . AVS Declined  Performed by:  Sena Hoopingarner, RN

## 2024-07-09 NOTE — Discharge Instructions (Signed)
 2/3-1-1 Rule Go to MAU for painful contractions every 2-3 minutes, lasting 1 minute each for 1.5 hours.

## 2024-07-09 NOTE — MAU Note (Signed)
 Michelle Calhoun is a 29 y.o. at [redacted]w[redacted]d here in MAU reporting: had some clear to pink fluid leak out around 1330.Continues to leaking out. Reports some braxton hicks ctx not very painnful . Good fetal movement.  3cm in office yesterday.   LMP:  Onset of complaint: 1330 Pain score: 2 Vitals:   07/09/24 1501  BP: 113/69  Pulse: 79  Resp: 18  Temp: 98.6 F (37 C)     FHT: 146  Lab orders placed from triage: fern

## 2024-07-13 ENCOUNTER — Ambulatory Visit

## 2024-07-13 MED ORDER — IRON SUCROSE 200 MG IVPB - SIMPLE MED: 200.0000 mg | Freq: Once | Status: DC

## 2024-07-15 ENCOUNTER — Ambulatory Visit: Admitting: Family Medicine

## 2024-07-15 VITALS — BP 120/76 | HR 81 | Wt 141.1 lb

## 2024-07-15 DIAGNOSIS — O99820 Streptococcus B carrier state complicating pregnancy: Secondary | ICD-10-CM | POA: Diagnosis not present

## 2024-07-15 DIAGNOSIS — Z8759 Personal history of other complications of pregnancy, childbirth and the puerperium: Secondary | ICD-10-CM

## 2024-07-15 DIAGNOSIS — O283 Abnormal ultrasonic finding on antenatal screening of mother: Secondary | ICD-10-CM

## 2024-07-15 DIAGNOSIS — J45909 Unspecified asthma, uncomplicated: Secondary | ICD-10-CM | POA: Diagnosis not present

## 2024-07-15 DIAGNOSIS — Z3A38 38 weeks gestation of pregnancy: Secondary | ICD-10-CM

## 2024-07-15 DIAGNOSIS — O99512 Diseases of the respiratory system complicating pregnancy, second trimester: Secondary | ICD-10-CM

## 2024-07-15 DIAGNOSIS — Z3480 Encounter for supervision of other normal pregnancy, unspecified trimester: Secondary | ICD-10-CM

## 2024-07-15 NOTE — Progress Notes (Signed)
 "  PRENATAL VISIT NOTE  Subjective:  Michelle Calhoun is a 29 y.o. G3P2002 at [redacted]w[redacted]d being seen today for ongoing prenatal care.  She is currently monitored for the following issues for this low-risk pregnancy and has Supervision of other normal pregnancy, antepartum; Sickle cell trait; Fetal echogenic intracardiac focus on prenatal ultrasound; History of gestational hypertension; Asthma affecting pregnancy in second trimester; Iron  deficiency anemia during pregnancy; and GBS (group B Streptococcus carrier), +RV culture, currently pregnant on their problem list.  Patient reports occasional contractions.  Contractions: Irritability. Vag. Bleeding: None.  Movement: Present. Denies leaking of fluid.   The following portions of the patient's history were reviewed and updated as appropriate: allergies, current medications, past family history, past medical history, past social history, past surgical history and problem list.   Objective:   Vitals:   07/15/24 0812  BP: 120/76  Pulse: 81  Weight: 141 lb 1.3 oz (64 kg)    Fetal Status:  Fetal Heart Rate (bpm): 137 Fundal Height: 38 cm Movement: Present Presentation: Vertex  General: Alert, oriented and cooperative. Patient is in no acute distress.  Skin: Skin is warm and dry. No rash noted.   Cardiovascular: Normal heart rate noted  Respiratory: Normal respiratory effort, no problems with respiration noted  Abdomen: Soft, gravid, appropriate for gestational age.  Pain/Pressure: Present     Pelvic: Cervical exam performed in the presence of a chaperone Dilation: 3.5 Effacement (%): 50 Station: -2  Extremities: Normal range of motion.  Edema: Trace  Mental Status: Normal mood and affect. Normal behavior. Normal judgment and thought content.      05/07/2024    8:34 AM 01/15/2024    8:54 AM 07/13/2015   12:52 PM  Depression screen PHQ 2/9  Decreased Interest 3 1 0  Down, Depressed, Hopeless 0 3 0  PHQ - 2 Score 3 4 0  Altered sleeping 0 2    Tired, decreased energy 1 3   Change in appetite 0 3   Feeling bad or failure about yourself  0 3   Trouble concentrating 0 2   Moving slowly or fidgety/restless 0 0   Suicidal thoughts 0 0   PHQ-9 Score 4  17       Data saved with a previous flowsheet row definition        05/07/2024    8:34 AM 01/15/2024    8:55 AM  GAD 7 : Generalized Anxiety Score  Nervous, Anxious, on Edge 0 1  Control/stop worrying 0 3  Worry too much - different things 0 3  Trouble relaxing 0 3  Restless 0 0  Easily annoyed or irritable 0 1  Afraid - awful might happen 0 2  Total GAD 7 Score 0 13    Assessment and Plan:  Pregnancy: G3P2002 at [redacted]w[redacted]d 1. [redacted] weeks gestation of pregnancy (Primary)  2. Supervision of other normal pregnancy, antepartum FHT normal Feels like baby has dropped  3. Fetal echogenic intracardiac focus on prenatal ultrasound  4. History of gestational hypertension BP normal  5. GBS (group B Streptococcus carrier), +RV culture, currently pregnant Intrapartum PPx  6. Asthma affecting pregnancy in second trimester    Term labor symptoms and general obstetric precautions including but not limited to vaginal bleeding, contractions, leaking of fluid and fetal movement were reviewed in detail with the patient. Please refer to After Visit Summary for other counseling recommendations.   No follow-ups on file.  Future Appointments  Date Time Provider Department Center  07/23/2024  10:55 AM Scot Shiraishi J, DO [facility] None    Jaislyn Blinn J Niobe Dick, DO  "

## 2024-07-18 ENCOUNTER — Inpatient Hospital Stay (HOSPITAL_COMMUNITY)
Admission: AD | Admit: 2024-07-18 | Discharge: 2024-07-20 | DRG: 807 | Disposition: A | Discharge status: Home / Self Care | Payer: Self-pay | Attending: Obstetrics and Gynecology | Admitting: Obstetrics and Gynecology

## 2024-07-18 ENCOUNTER — Other Ambulatory Visit: Payer: Self-pay

## 2024-07-18 ENCOUNTER — Encounter (HOSPITAL_COMMUNITY): Payer: Self-pay | Admitting: Obstetrics & Gynecology

## 2024-07-18 ENCOUNTER — Inpatient Hospital Stay (HOSPITAL_COMMUNITY): Admitting: Anesthesiology

## 2024-07-18 DIAGNOSIS — F419 Anxiety disorder, unspecified: Secondary | ICD-10-CM | POA: Diagnosis present

## 2024-07-18 DIAGNOSIS — Z8249 Family history of ischemic heart disease and other diseases of the circulatory system: Secondary | ICD-10-CM | POA: Diagnosis not present

## 2024-07-18 DIAGNOSIS — J45909 Unspecified asthma, uncomplicated: Secondary | ICD-10-CM | POA: Diagnosis present

## 2024-07-18 DIAGNOSIS — O99344 Other mental disorders complicating childbirth: Secondary | ICD-10-CM | POA: Diagnosis present

## 2024-07-18 DIAGNOSIS — O9902 Anemia complicating childbirth: Secondary | ICD-10-CM | POA: Diagnosis present

## 2024-07-18 DIAGNOSIS — O134 Gestational [pregnancy-induced] hypertension without significant proteinuria, complicating childbirth: Principal | ICD-10-CM | POA: Diagnosis present

## 2024-07-18 DIAGNOSIS — O99820 Streptococcus B carrier state complicating pregnancy: Secondary | ICD-10-CM

## 2024-07-18 DIAGNOSIS — F32A Depression, unspecified: Secondary | ICD-10-CM | POA: Diagnosis present

## 2024-07-18 DIAGNOSIS — Z3480 Encounter for supervision of other normal pregnancy, unspecified trimester: Secondary | ICD-10-CM

## 2024-07-18 DIAGNOSIS — D573 Sickle-cell trait: Secondary | ICD-10-CM | POA: Diagnosis present

## 2024-07-18 DIAGNOSIS — O99824 Streptococcus B carrier state complicating childbirth: Secondary | ICD-10-CM | POA: Diagnosis present

## 2024-07-18 DIAGNOSIS — Z8759 Personal history of other complications of pregnancy, childbirth and the puerperium: Secondary | ICD-10-CM

## 2024-07-18 DIAGNOSIS — Z3A38 38 weeks gestation of pregnancy: Secondary | ICD-10-CM | POA: Diagnosis not present

## 2024-07-18 DIAGNOSIS — O26893 Other specified pregnancy related conditions, third trimester: Secondary | ICD-10-CM | POA: Diagnosis present

## 2024-07-18 LAB — CBC
HCT: 30.9 % — ABNORMAL LOW (ref 36.0–46.0)
Hemoglobin: 10.1 g/dL — ABNORMAL LOW (ref 12.0–15.0)
MCH: 26.4 pg (ref 26.0–34.0)
MCHC: 32.7 g/dL (ref 30.0–36.0)
MCV: 80.7 fL (ref 80.0–100.0)
Platelets: 211 K/uL (ref 150–400)
RBC: 3.83 MIL/uL — ABNORMAL LOW (ref 3.87–5.11)
RDW: 21.2 % — ABNORMAL HIGH (ref 11.5–15.5)
WBC: 6.4 K/uL (ref 4.0–10.5)
nRBC: 0 % (ref 0.0–0.2)

## 2024-07-18 LAB — TYPE AND SCREEN
ABO/RH(D): A POS
Antibody Screen: NEGATIVE

## 2024-07-18 MED ORDER — PHENYLEPHRINE 80 MCG/ML (10ML) SYRINGE FOR IV PUSH (FOR BLOOD PRESSURE SUPPORT): 80.0000 ug | PREFILLED_SYRINGE | INTRAVENOUS | Status: DC | PRN

## 2024-07-18 MED ORDER — ZOLPIDEM TARTRATE 5 MG PO TABS: 5.0000 mg | ORAL_TABLET | Freq: Every evening | ORAL | Status: DC | PRN

## 2024-07-18 MED ORDER — PRENATAL MULTIVITAMIN CH
1.0000 | ORAL_TABLET | Freq: Every day | ORAL | Status: DC
  Administered 2024-07-19 – 2024-07-20 (×2): 1 via ORAL
  Filled 2024-07-18 (×2): qty 1

## 2024-07-18 MED ORDER — SIMETHICONE 80 MG PO CHEW: 80.0000 mg | CHEWABLE_TABLET | ORAL | Status: DC | PRN

## 2024-07-18 MED ORDER — OXYCODONE-ACETAMINOPHEN 5-325 MG PO TABS: 2.0000 | ORAL_TABLET | ORAL | Status: DC | PRN

## 2024-07-18 MED ORDER — ONDANSETRON HCL 4 MG/2ML IJ SOLN: 4.0000 mg | INTRAMUSCULAR | Status: DC | PRN

## 2024-07-18 MED ORDER — OXYTOCIN-SODIUM CHLORIDE 30-0.9 UT/500ML-% IV SOLN
2.5000 [IU]/h | INTRAVENOUS | Status: DC
  Filled 2024-07-18: qty 500

## 2024-07-18 MED ORDER — OXYCODONE-ACETAMINOPHEN 5-325 MG PO TABS: 1.0000 | ORAL_TABLET | ORAL | Status: DC | PRN

## 2024-07-18 MED ORDER — EPHEDRINE 5 MG/ML INJ: 10.0000 mg | INTRAVENOUS | Status: DC | PRN

## 2024-07-18 MED ORDER — LACTATED RINGERS IV SOLN
500.0000 mL | Freq: Once | INTRAVENOUS | Status: AC
  Administered 2024-07-18: 500 mL via INTRAVENOUS

## 2024-07-18 MED ORDER — LACTATED RINGERS IV SOLN: INTRAVENOUS | Status: DC

## 2024-07-18 MED ORDER — DIPHENHYDRAMINE HCL 50 MG/ML IJ SOLN
12.5000 mg | INTRAMUSCULAR | Status: DC | PRN
  Administered 2024-07-18: 12.5 mg via INTRAVENOUS
  Filled 2024-07-18: qty 1

## 2024-07-18 MED ORDER — FENTANYL-BUPIVACAINE-NACL 0.5-0.125-0.9 MG/250ML-% EP SOLN: 12.0000 mL/h | EPIDURAL | Status: DC | PRN

## 2024-07-18 MED ORDER — PENICILLIN G POT IN DEXTROSE 60000 UNIT/ML IV SOLN
3.0000 10*6.[IU] | INTRAVENOUS | Status: DC
  Administered 2024-07-18: 3 10*6.[IU] via INTRAVENOUS
  Filled 2024-07-18 (×4): qty 50

## 2024-07-18 MED ORDER — COCONUT OIL OIL: 1.0000 | TOPICAL_OIL | Status: DC | PRN

## 2024-07-18 MED ORDER — LIDOCAINE HCL (PF) 1 % IJ SOLN: 30.0000 mL | INTRAMUSCULAR | Status: DC | PRN

## 2024-07-18 MED ORDER — DIPHENHYDRAMINE HCL 25 MG PO CAPS: 25.0000 mg | ORAL_CAPSULE | Freq: Four times a day (QID) | ORAL | Status: DC | PRN

## 2024-07-18 MED ORDER — DIBUCAINE (PERIANAL) 1 % EX OINT: 1.0000 | TOPICAL_OINTMENT | CUTANEOUS | Status: DC | PRN

## 2024-07-18 MED ORDER — TETANUS-DIPHTH-ACELL PERTUSSIS 5-2-15.5 LF-MCG/0.5 IM SUSP: 0.5000 mL | Freq: Once | INTRAMUSCULAR | Status: DC

## 2024-07-18 MED ORDER — SENNOSIDES-DOCUSATE SODIUM 8.6-50 MG PO TABS
2.0000 | ORAL_TABLET | Freq: Every day | ORAL | Status: DC
  Administered 2024-07-19 – 2024-07-20 (×2): 2 via ORAL
  Filled 2024-07-18 (×2): qty 2

## 2024-07-18 MED ORDER — SOD CITRATE-CITRIC ACID 500-334 MG/5ML PO SOLN: 30.0000 mL | ORAL | Status: DC | PRN

## 2024-07-18 MED ORDER — ONDANSETRON HCL 4 MG/2ML IJ SOLN
4.0000 mg | Freq: Four times a day (QID) | INTRAMUSCULAR | Status: DC | PRN
  Administered 2024-07-18: 4 mg via INTRAVENOUS
  Filled 2024-07-18: qty 2

## 2024-07-18 MED ORDER — ACETAMINOPHEN 325 MG PO TABS: 650.0000 mg | ORAL_TABLET | ORAL | Status: DC | PRN

## 2024-07-18 MED ORDER — ACETAMINOPHEN 325 MG PO TABS
650.0000 mg | ORAL_TABLET | ORAL | Status: DC | PRN
  Administered 2024-07-19 – 2024-07-20 (×5): 650 mg via ORAL
  Filled 2024-07-18 (×5): qty 2

## 2024-07-18 MED ORDER — FENTANYL-BUPIVACAINE-NACL 0.5-0.125-0.9 MG/250ML-% EP SOLN
12.0000 mL/h | EPIDURAL | Status: DC | PRN
  Administered 2024-07-18: 12 mL/h via EPIDURAL
  Filled 2024-07-18: qty 250

## 2024-07-18 MED ORDER — ONDANSETRON HCL 4 MG PO TABS: 4.0000 mg | ORAL_TABLET | ORAL | Status: DC | PRN

## 2024-07-18 MED ORDER — DIPHENHYDRAMINE HCL 50 MG/ML IJ SOLN: 12.5000 mg | INTRAMUSCULAR | Status: DC | PRN

## 2024-07-18 MED ORDER — LACTATED RINGERS IV SOLN: 500.0000 mL | Freq: Once | INTRAVENOUS | Status: DC

## 2024-07-18 MED ORDER — IBUPROFEN 600 MG PO TABS
600.0000 mg | ORAL_TABLET | Freq: Four times a day (QID) | ORAL | Status: DC
  Administered 2024-07-19 – 2024-07-20 (×7): 600 mg via ORAL
  Filled 2024-07-18 (×7): qty 1

## 2024-07-18 MED ORDER — WITCH HAZEL-GLYCERIN EX PADS: 1.0000 | MEDICATED_PAD | CUTANEOUS | Status: DC | PRN

## 2024-07-18 MED ORDER — BENZOCAINE-MENTHOL 20-0.5 % EX AERO
1.0000 | INHALATION_SPRAY | CUTANEOUS | Status: DC | PRN
  Administered 2024-07-19: 1 via TOPICAL
  Filled 2024-07-18: qty 56

## 2024-07-18 MED ORDER — SODIUM CHLORIDE 0.9 % IV SOLN
5.0000 10*6.[IU] | Freq: Once | INTRAVENOUS | Status: AC
  Administered 2024-07-18 (×2): 5 10*6.[IU] via INTRAVENOUS
  Filled 2024-07-18: qty 5

## 2024-07-18 MED ORDER — OXYTOCIN BOLUS FROM INFUSION
333.0000 mL | Freq: Once | INTRAVENOUS | Status: AC
  Administered 2024-07-18: 333 mL via INTRAVENOUS

## 2024-07-18 MED ORDER — LACTATED RINGERS IV SOLN: 500.0000 mL | INTRAVENOUS | Status: DC | PRN

## 2024-07-18 MED ORDER — LIDOCAINE HCL (PF) 1 % IJ SOLN
INTRAMUSCULAR | Status: DC | PRN
  Administered 2024-07-18: 8 mL via EPIDURAL

## 2024-07-18 NOTE — Progress Notes (Signed)
 Patient ID: Michelle Calhoun, female   DOB: 01/13/1996, 29 y.o.   MRN: [id number]   Patient is comfortable with epidural   BP 121/81   Pulse 77   Temp 98.6 F (37 C) (Oral)   Resp 16   Ht 5' 3 (1.6 m)   Wt 67.1 kg   LMP 10/21/2023   SpO2 100%   BMI 26.22 kg/m    FHT:  FHR: 120 bpm, variability: Mod,  accelerations:  ++,  decelerations:  None UC:   Q 2-5 minutes, Regular Dilation: 5 Effacement (%): 80 Station: -2 Presentation: Vertex Exam by:: N. Dario, RN  Labs: Results for orders placed or performed during the hospital encounter of 07/18/24 (from the past 24 hours)  CBC     Status: Abnormal   Collection Time: 07/18/24  4:32 PM  Result Value Ref Range   WBC 6.4 4.0 - 10.5 K/uL   RBC 3.83 (L) 3.87 - 5.11 MIL/uL   Hemoglobin 10.1 (L) 12.0 - 15.0 g/dL   HCT 69.0 (L) 63.9 - 53.9 %   MCV 80.7 80.0 - 100.0 fL   MCH 26.4 26.0 - 34.0 pg   MCHC 32.7 30.0 - 36.0 g/dL   RDW 78.7 (H) 88.4 - 84.4 %   Platelets 211 150 - 400 K/uL   nRBC 0.0 0.0 - 0.2 %  Type and screen [facility]     Status: None   Collection Time: 07/18/24  4:32 PM  Result Value Ref Range   ABO/RH(D) A POS    Antibody Screen NEG    Sample Expiration      07/21/2024,2359 Performed at [facility], [address] [place], [place] [address]     Assessment / Plan: [redacted]w[redacted]d week IUP  Labor: SOL. GBS +. Plan to continue expectant management. Will perform cervical check 4 hours post PCN dose, and possibly AROM if unchanged.   Fetal Wellbeing:  Category 1 Pain Control:  Epidural Anticipated MOD:  Vaginal  Lorrena Goranson L, Student-MidWife 07/18/2024 6:30 PM

## 2024-07-18 NOTE — MAU Note (Signed)
 MAU Labor Triage Note: .Michelle Calhoun is a 29 y.o. at [redacted]w[redacted]d here in MAU reporting:  Contractions every: 5-10 minutes Onset of ctx: 0750 Pain Score: 7  Pain Location: Abdomen  ROM: denies Vaginal Bleeding: denies Last SVE: 3.5 Labor Pain Management Plan: Planning epidural  GBS: Positive  Fetal Movement: states baby is not as active as usual FHT: Fetal Heart Rate Mode: External Baseline Rate (A): 150 bpm Multiple birth?: No  Vitals:   07/18/24 1425  BP: 117/77  Pulse: 84  Resp: 18  Temp: 98.6 F (37 C)  SpO2: 100%      Lab orders placed from triage: MAU Labor Eval OB Office: Faculty

## 2024-07-18 NOTE — H&P (Cosign Needed Addendum)
 OBSTETRIC ADMISSION HISTORY AND PHYSICAL  Michelle Calhoun is a 29 y.o. female G3P2002 with IUP at [redacted]w[redacted]d by LMP c/w 10.5week US  presenting for SOL. She reports +FMs, No LOF, no VB, no blurry vision, headaches or peripheral edema, and RUQ pain.  She plans on breast and bottle feeding. She request Depo-Provera  for birth control. She received her prenatal care at [facility].   Dating: By LMP --->  Estimated Date of Delivery: 07/27/24  Sono:    @[redacted]w[redacted]d , CWD, normal anatomy, Cephalic presentation, Posterior Fundal Placental lie, 654g, 36% EFW   Prenatal History/Complications:  History of gestational hypertension- Takes 81mg  of aspirin  daily Sickle cell trait Echogenic intracardiac focus GBS Positive Asthma  Past Medical History: Past Medical History:  Diagnosis Date   Asthma    Hx of chlamydia infection    Hypertension    Vaginal Pap smear, abnormal     Past Surgical History: Past Surgical History:  Procedure Laterality Date   NO PAST SURGERIES      Obstetrical History: OB History     Gravida  3   Para  2   Term  2   Preterm      AB      Living  2      SAB      IAB      Ectopic      Multiple  0   Live Births  2           Social History Social History   Socioeconomic History   Marital status: Married    Spouse name: Janae Code   Number of children: 2   Years of education: Not on file   Highest education level: Not on file  Occupational History   Not on file  Tobacco Use   Smoking status: Never   Smokeless tobacco: Never  Vaping Use   Vaping status: Never Used  Substance and Sexual Activity   Alcohol use: Not Currently   Drug use: No   Sexual activity: Yes    Birth control/protection: None  Other Topics Concern   Not on file  Social History Narrative   Not on file   Social Drivers of Health   Tobacco Use: Low Risk (07/18/2024)   Patient History    Smoking Tobacco Use: Never    Smokeless Tobacco Use: Never    Passive  Exposure: Not on file  Financial Resource Strain: Not on file  Food Insecurity: No Food Insecurity (07/09/2024)   Epic    Worried About Programme Researcher, Broadcasting/film/video in the Last Year: Never true    Ran Out of Food in the Last Year: Never true  Transportation Needs: Unknown (07/09/2024)   Epic    Lack of Transportation (Medical): Not on file    Lack of Transportation (Non-Medical): No  Physical Activity: Not on file  Stress: Not on file  Social Connections: Not on file  Depression (PHQ2-9): Low Risk (05/07/2024)   Depression (PHQ2-9)    PHQ-2 Score: 4  Alcohol Screen: Not on file  Housing: Unknown (07/09/2024)   Epic    Unable to Pay for Housing in the Last Year: No    Number of Times Moved in the Last Year: Not on file    Homeless in the Last Year: Not on file  Utilities: Not At Risk (09/16/2022)   AHC Utilities    Threatened with loss of utilities: No  Health Literacy: Not on file    Family History: Family History  Problem  Relation Age of Onset   Healthy Mother    Healthy Father    Asthma Brother    Hypertension Maternal Grandmother    Alcohol abuse Neg Hx    Arthritis Neg Hx    Birth defects Neg Hx    Cancer Neg Hx    COPD Neg Hx    Depression Neg Hx    Diabetes Neg Hx    Drug abuse Neg Hx    Early death Neg Hx    Hearing loss Neg Hx    Hyperlipidemia Neg Hx    Heart disease Neg Hx    Kidney disease Neg Hx    Learning disabilities Neg Hx    Mental illness Neg Hx    Mental retardation Neg Hx    Miscarriages / Stillbirths Neg Hx    Stroke Neg Hx    Vision loss Neg Hx    Varicose Veins Neg Hx     Allergies: Allergies[1]  Medications Prior to Admission  Medication Sig Dispense Refill Last Dose/Taking   aspirin  EC 81 MG tablet Take 1 tablet (81 mg total) by mouth at bedtime. Start taking when you are [redacted] weeks pregnant for rest of pregnancy for prevention of preeclampsia 300 tablet 2 07/17/2024   DULoxetine (CYMBALTA) 20 MG capsule Take 20 mg by mouth daily.   07/17/2024    ondansetron  (ZOFRAN ) 24 MG tablet Take 8 mg by mouth 2 (two) times daily.   07/17/2024   Prenatal Vit-Fe Fumarate-FA (PRENATAL MULTIVITAMIN) TABS tablet Take 1 tablet by mouth daily at 12 noon.   07/17/2024   fluconazole  (DIFLUCAN ) 150 MG tablet Take 1 tablet (150 mg total) by mouth daily. (Patient not taking: Reported on 07/15/2024) 1 tablet 0    metroNIDAZOLE  (FLAGYL ) 500 MG tablet Take 1 tablet (500 mg total) by mouth 2 (two) times daily. (Patient not taking: Reported on 07/15/2024) 14 tablet 0    sertraline  (ZOLOFT ) 50 MG tablet Take 0.5 tablets (25 mg total) by mouth daily. 30 tablet 6      Review of Systems  HPI    Pertinent items noted in HPI and remainder of comprehensive ROS otherwise negative.   Blood pressure 117/77, pulse 84, temperature 98.6 F (37 C), temperature source Oral, resp. rate 18, height 5' 3 (1.6 m), weight 67.1 kg, last menstrual period 10/21/2023, SpO2 100%, unknown if currently breastfeeding.  General appearance: alert, cooperative, appears stated age, and no distress Lungs: normal effort, no distress noted Heart: regular rate, warm and well perfused Abdomen: soft, non-tender; bowel sounds normal Pelvic: normal external female genitalia, no lesions or bleeding Extremities:  no sign of DVT Presentation: cephalic   Presentation: cephalic Fetal monitoring: Baseline: 130 bpm, Variability: Good {> 6 bpm), Accelerations: Reactive, and Decelerations: Absent Uterine activity: Frequency: Every 1-3 minutes Dilation: 5 Effacement (%): 80 Station: -2 Exam by:: N. Dario, RN   Prenatal labs: ABO, Rh: --/--/PENDING (01/17 1632) Antibody: PENDING (01/17 1632) Rubella: 2.00 (07/01 0928) RPR: Non Reactive (11/06 0920)  HBsAg: Negative (07/01 0928)  HIV: Non Reactive (11/06 0920)  GBS: Positive/-- (12/31 0945)    Lab Results  Component Value Date   GBS Positive (A) 07/01/2024   GTT Early HgbA1C: 5.1   Genetic screening  NIPS: LR female  AFP: neg Anatomy US   Normal  Immunization History  Administered Date(s) Administered   Tdap 05/07/2024    Prenatal Transfer Tool  Maternal Diabetes: No Genetic Screening: Normal Maternal Ultrasounds/Referrals: Normal Fetal Ultrasounds or other Referrals:  None Maternal Substance Abuse:  No Significant Maternal Medications:  Meds include: Other: Aspirin , Cymbalta Significant Maternal Lab Results: Group B Strep positive Number of Prenatal Visits:greater than 3 verified prenatal visits Maternal Vaccinations: N/A Other Comments:  N/A   Results for orders placed or performed during the hospital encounter of 07/18/24 (from the past 24 hours)  CBC   Collection Time: 07/18/24  4:32 PM  Result Value Ref Range   WBC 6.4 4.0 - 10.5 K/uL   RBC 3.83 (L) 3.87 - 5.11 MIL/uL   Hemoglobin 10.1 (L) 12.0 - 15.0 g/dL   HCT 69.0 (L) 63.9 - 53.9 %   MCV 80.7 80.0 - 100.0 fL   MCH 26.4 26.0 - 34.0 pg   MCHC 32.7 30.0 - 36.0 g/dL   RDW 78.7 (H) 88.4 - 84.4 %   Platelets 211 150 - 400 K/uL   nRBC 0.0 0.0 - 0.2 %  Type and screen [facility]   Collection Time: 07/18/24  4:32 PM  Result Value Ref Range   ABO/RH(D) PENDING    Antibody Screen PENDING    Sample Expiration      07/21/2024,2359 Performed at [facility], [address] [place], [place] [address]     Patient Active Problem List   Diagnosis Date Noted   Normal labor 07/18/2024   GBS (group B Streptococcus carrier), +RV culture, currently pregnant 07/03/2024   Iron  deficiency anemia during pregnancy 05/11/2024   Fetal echogenic intracardiac focus on prenatal ultrasound 04/07/2024   History of gestational hypertension 04/07/2024   Asthma affecting pregnancy in second trimester 04/07/2024   Sickle cell trait 01/24/2024   Supervision of other normal pregnancy, antepartum 12/31/2023    Assessment/Plan:  NIKITA SURMAN is a 29 y.o. G3P2002 at [redacted]w[redacted]d here for SOL  #Labor: Arrived from MAU 5/80/-2, with q78m contractions.  Plan to continue expectant management and perform SVE when clinically indicated. #Pain: Epidural #FWB: Category 1 #GBS status:  positive #Feeding: Breastmilk  and Formula #Reproductive Life planning: Depo Provera  #Circ:  yes  Breanna L Drumgoole, Student-MidWife  07/18/2024, 5:02 PM   Midwife Attestation:  I personally saw and evaluated the patient, performing the key elements of the service. I developed and verified the management plan that is described in the resident's/student's note, and I agree with the content with my edits above. VSS, HRR&R, Resp unlabored, Legs neg.    Olam Boards, CNM 11:36 PM        [1] No Known Allergies

## 2024-07-18 NOTE — Anesthesia Preprocedure Evaluation (Signed)
"                                    Anesthesia Evaluation  Patient identified by MRN, date of birth, ID band Patient awake    Reviewed: Allergy & Precautions, H&P , NPO status , Patient's Chart, lab work & pertinent test results  Airway Mallampati: II  TM Distance: >3 FB Neck ROM: Full    Dental no notable dental hx. (+) Teeth Intact, Dental Advisory Given   Pulmonary asthma    Pulmonary exam normal breath sounds clear to auscultation       Cardiovascular hypertension, negative cardio ROS Normal cardiovascular exam Rhythm:Regular Rate:Normal     Neuro/Psych negative neurological ROS  negative psych ROS   GI/Hepatic negative GI ROS, Neg liver ROS,,,  Endo/Other  negative endocrine ROS    Renal/GU negative Renal ROS  negative genitourinary   Musculoskeletal negative musculoskeletal ROS (+)    Abdominal   Peds negative pediatric ROS (+)  Hematology  (+) Blood dyscrasia, Sickle cell trait and anemia   Anesthesia Other Findings   Reproductive/Obstetrics negative OB ROS                              Anesthesia Physical Anesthesia Plan  ASA: 3  Anesthesia Plan: Epidural   Post-op Pain Management: Minimal or no pain anticipated   Induction: Intravenous  PONV Risk Score and Plan: 2  Airway Management Planned: Natural Airway  Additional Equipment: Fetal Monitoring  Intra-op Plan:   Post-operative Plan:   Informed Consent: I have reviewed the patients History and Physical, chart, labs and discussed the procedure including the risks, benefits and alternatives for the proposed anesthesia with the patient or authorized representative who has indicated his/her understanding and acceptance.     Dental Advisory Given  Plan Discussed with: Anesthesiologist and CRNA  Anesthesia Plan Comments: (Labs checked- platelets confirmed with RN in room. Fetal heart tracing, per RN, reported to be stable enough for sitting  procedure. Discussed epidural, and patient consents to the procedure:  included risk of possible headache,backache, failed block, allergic reaction, and nerve injury. This patient was asked if she had any questions or concerns before the procedure started.)        Anesthesia Quick Evaluation  "

## 2024-07-18 NOTE — Anesthesia Procedure Notes (Signed)
 Epidural Patient location during procedure: OB Start time: 07/18/2024 5:06 PM End time: 07/18/2024 5:10 PM  Staffing Anesthesiologist: Mallory Manus, MD Performed: other anesthesia staff   Preanesthetic Checklist Completed: patient identified, IV checked, site marked, risks and benefits discussed, surgical consent, monitors and equipment checked, pre-op evaluation and timeout performed  Epidural Patient position: sitting Prep: DuraPrep and site prepped and draped Patient monitoring: continuous pulse ox and blood pressure Approach: midline Location: L2-L3 Injection technique: LOR air  Needle:  Needle type: Tuohy  Needle gauge: 17 G Needle length: 9 cm and 9 Needle insertion depth: 6 cm Catheter type: closed end flexible Catheter size: 19 Gauge Catheter at skin depth: 12 cm Test dose: negative  Assessment Events: blood not aspirated, no cerebrospinal fluid, injection not painful, no injection resistance, no paresthesia and negative IV test

## 2024-07-18 NOTE — Anesthesia Postprocedure Evaluation (Signed)
"   Anesthesia Post Note  Patient: Michelle Calhoun  Procedure(s) Performed: AN AD HOC LABOR EPIDURAL     Anesthesia Type: Epidural Anesthetic complications: no   No notable events documented.  Last Vitals:  Vitals:   07/18/24 2000 07/18/24 2030  BP: 120/82 113/70  Pulse: 65 62  Resp:    Temp:    SpO2:      Last Pain:  Vitals:   07/18/24 1736  TempSrc:   PainSc: 3    Pain Goal:                   Sun Wilensky      "

## 2024-07-18 NOTE — Discharge Summary (Signed)
 "    Postpartum Discharge Summary  Date of Service updated***     Patient Name: Michelle Calhoun DOB: Apr 24, 1996 MRN: [id number]  Date of admission: 07/18/2024 Delivery date:07/18/2024 Delivering provider: MILLY PLANAS A Date of discharge: 07/18/2024  Admitting diagnosis: Normal labor [O80, Z37.9] Intrauterine pregnancy: [redacted]w[redacted]d     Secondary diagnosis:  Principal Problem:   Normal labor Active Problems:   SVD (spontaneous vaginal delivery)  Additional problems: ***    Discharge diagnosis: Term Pregnancy Delivered                                              Post partum procedures:{Postpartum procedures:23558} Augmentation: N/A Complications: None  Hospital course: Onset of Labor With Vaginal Delivery      29 y.o. yo [id number] at [redacted]w[redacted]d was admitted in Active Labor on 07/18/2024. Labor course was uncomplicated   Membrane Rupture Time/Date:  ,   Delivery Method:Vaginal, Spontaneous Operative Delivery:N/A Episiotomy: None Lacerations:  None Patient had a postpartum course complicated by ***.  She is ambulating, tolerating a regular diet, passing flatus, and urinating well. Patient is discharged home in stable condition on 07/18/24.  Newborn Data: Birth date:07/18/2024 Birth time:9:04 PM Gender:Female Living status:Living Apgars:9 ,9  Weight:   Magnesium  Sulfate received: No BMZ received: No Rhophylac:No MMR:No T-DaP:Given prenatally Flu: No RSV Vaccine received: No Transfusion:No  Immunizations received: Immunization History  Administered Date(s) Administered   Tdap 05/07/2024    Physical exam  Vitals:   07/18/24 1831 07/18/24 2000 07/18/24 2030 07/18/24 2101  BP: 118/79 120/82 113/70 102/70  Pulse: 74 65 62 88  Resp:      Temp:      TempSrc:      SpO2:      Weight:      Height:       General: {Exam; general:21111117} Lochia: {Desc; appropriate/inappropriate:30686::appropriate} Uterine Fundus: {Desc; firm/soft:30687} Incision: {Exam;  incision:21111123} DVT Evaluation: {Exam; dvt:2111122} Labs: Lab Results  Component Value Date   WBC 6.4 07/18/2024   HGB 10.1 (L) 07/18/2024   HCT 30.9 (L) 07/18/2024   MCV 80.7 07/18/2024   PLT 211 07/18/2024      Latest Ref Rng & Units 12/09/2023    5:10 PM  CMP  Glucose 70 - 99 mg/dL 95   BUN 6 - 20 mg/dL 5   Creatinine 9.55 - 8.99 mg/dL 9.42   Sodium 864 - 854 mmol/L 136   Potassium 3.5 - 5.1 mmol/L 4.0   Chloride 98 - 111 mmol/L 102   CO2 22 - 32 mmol/L 23   Calcium 8.9 - 10.3 mg/dL 9.4   Total Protein 6.5 - 8.1 g/dL 7.4   Total Bilirubin 0.0 - 1.2 mg/dL 0.3   Alkaline Phos 38 - 126 U/L 50   AST 15 - 41 U/L 13   ALT 0 - 44 U/L 6    Edinburgh Score:    09/19/2022    9:08 AM  Edinburgh Postnatal Depression Scale Screening Tool  I have been able to laugh and see the funny side of things. 0   I have looked forward with enjoyment to things. 0   I have blamed myself unnecessarily when things went wrong. 0   I have been anxious or worried for no good reason. 0   I have felt scared or panicky for no good reason. 0   Things have been getting on  top of me. 0   I have been so unhappy that I have had difficulty sleeping. 0   I have felt sad or miserable. 0   I have been so unhappy that I have been crying. 0   The thought of harming myself has occurred to me. 0   Edinburgh Postnatal Depression Scale Total 0      Data saved with a previous flowsheet row definition   No data recorded  After visit meds:  Allergies as of 07/18/2024   No Known Allergies   Med Rec must be completed prior to using this [facility]***        Discharge home in stable condition Infant Feeding: {Baby feeding:23562} Infant Disposition:{CHL IP OB HOME WITH FNUYZM:76418} Discharge instruction: per After Visit Summary and Postpartum booklet. Activity: Advance as tolerated. Pelvic rest for 6 weeks.  Diet: {OB ipzu:78888878} Future Appointments:No future appointments. Follow up  Visit:  Message sent to [facility] on 07/18/2024 Please schedule this patient for a In person postpartum visit in 6 weeks with the following provider: Any provider. Additional Postpartum F/U:N/A  Low risk pregnancy complicated by: N/A Delivery mode:  Vaginal, Spontaneous Anticipated Birth Control:  Depo   07/18/2024 Tawni SAUNDERS Laiya Wisby, CNM    "

## 2024-07-19 ENCOUNTER — Encounter (HOSPITAL_COMMUNITY): Payer: Self-pay | Admitting: Obstetrics & Gynecology

## 2024-07-19 LAB — SYPHILIS: RPR W/REFLEX TO RPR TITER AND TREPONEMAL ANTIBODIES, TRADITIONAL SCREENING AND DIAGNOSIS ALGORITHM: RPR Ser Ql: NONREACTIVE

## 2024-07-19 MED ORDER — OXYCODONE HCL 5 MG PO TABS
5.0000 mg | ORAL_TABLET | ORAL | Status: DC | PRN
  Administered 2024-07-19 (×3): 5 mg via ORAL
  Filled 2024-07-19 (×3): qty 1

## 2024-07-19 MED ORDER — OXYCODONE HCL 5 MG PO TABS
10.0000 mg | ORAL_TABLET | ORAL | Status: DC | PRN
  Administered 2024-07-20: 10 mg via ORAL
  Filled 2024-07-19: qty 2

## 2024-07-19 MED ORDER — SERTRALINE HCL 25 MG PO TABS
25.0000 mg | ORAL_TABLET | Freq: Every day | ORAL | Status: DC
  Administered 2024-07-19 – 2024-07-20 (×2): 25 mg via ORAL
  Filled 2024-07-19 (×2): qty 1

## 2024-07-19 MED ORDER — MEDROXYPROGESTERONE ACETATE 150 MG/ML IM SUSP
150.0000 mg | Freq: Once | INTRAMUSCULAR | Status: AC
  Administered 2024-07-20: 150 mg via INTRAMUSCULAR
  Filled 2024-07-19: qty 1

## 2024-07-19 NOTE — Progress Notes (Signed)
 Post Partum Day 1 Subjective: no complaints, up ad lib, voiding, tolerating PO, and + flatus  Objective: Blood pressure (!) 86/73, pulse 83, temperature 98.2 F (36.8 C), temperature source Oral, resp. rate 18, height 5' 3 (1.6 m), weight 67.1 kg, last menstrual period 10/21/2023, SpO2 100%, unknown if currently breastfeeding.  Physical Exam:  General: alert, cooperative, and no distress Lochia: appropriate Uterine Fundus: firm Incision: N/A DVT Evaluation: No evidence of DVT seen on physical exam.  Recent Labs    07/18/24 1632  HGB 10.1*  HCT 30.9*    Assessment/Plan: Plan for discharge tomorrow, Breastfeeding, and Contraception Depo-provera    LOS: 1 day   Tawni SAUNDERS Latanza Pfefferkorn, CNM 07/19/2024, 7:58 AM

## 2024-07-19 NOTE — Lactation Note (Signed)
 This note was copied from a baby's chart. Lactation Consultation Note  Patient Name: Michelle Calhoun Unijb'd Date: 07/19/2024 Age:29 hours Reason for consult: Initial assessment;Early term 37-38.6wks  P3. Mom has a 48 yr old that she didn't BF and her 2nd child will be 2 in March mom BF for 1 month.  Mom hopes to BF this child for a little bit. Newborn feeding habits, behavior, STS, I&O reviewed.  Mom encouraged to feed baby 8-12 times/24 hours and with feeding cues. Wake baby q 3 hrs if hasn't cued before then. Mom wonders if the baby got anything from her breast.mom demonstrated hand expression w/a dot of colostrum noted. Mom wanted formula to give a supplement after BF if baby isn't satisfied on the breast.  Encouraged mom to call for assistance as needed and to see latch. Maternal Data Does the patient have breastfeeding experience prior to this delivery?: Yes How long did the patient breastfeed?: 2nd child will be 2 in March BF for 1 month  Feeding Mother's Current Feeding Choice: Breast Milk and Formula  LATCH Score       Type of Nipple: Everted at rest and after stimulation  Comfort (Breast/Nipple): Soft / non-tender         Lactation Tools Discussed/Used    Interventions Interventions: Breast feeding basics reviewed;Skin to skin;Hand express;Breast compression;Education;LC Services brochure  Discharge Pump: Hands Free ([place])  Consult Status Consult Status: Follow-up Date: 07/19/24 Follow-up type: In-patient    Nicolas Sisler G 07/19/2024, 3:09 AM

## 2024-07-19 NOTE — Progress Notes (Signed)
 CSW received consult for hx of Anxiety and Depression. CSW met with MOB to offer support and complete assessment. When CSW entered room, MOB was observed sitting in hospital bed. Infant was asleep on her back in bassinet. FOB was present. CSW introduced self and requested to speak with MOB alone. MOB provided verbal consent for CSW to complete consult with FOB sitting on couch nearby. CSW explained reason for consult. MOB was agreeable to consult and remained engaged throughout encounter.   CSW assessed current mood and inquired about mental health history. MOB reports feeling up and down, noting that her back has been hurting her due to her epidural. MOB reports her care team has been giving her pain medication as management and denied needing additional intervention at this time. MOB reports having an easy delivery and is feeling well emotionally since infant's arrival. MOB acknowledged a history of depression and anxiety, stating she is unsure of when she was first diagnosed but reports her diagnosis occurred since having children (MOB's oldest child will be 71 years old in March.) MOB reports having symptoms of anxiety and depression in the beginning of her pregnancy, which she described as feeling sad, staying in bed, racing thoughts, and having trouble sleeping. CSW inquired about current mental health treatment. MOB reports she was started on sertraline 25mg  daily at her initial prenatal care appointment 7/25. MOB states she is still taking sertraline but has not taken the medication since hospital admission. MOB states she would like to be administered sertraline while inpatient, starting tomorrow morning. CSW notified RN of medication request. MOB reports sertraline has been effective managing her mental health symptoms. MOB states she is not current with a therapist but was referred to therapy by her OBGYN. MOB expressed interest in additional mental health resources, which CSW provided. CSW inquired  if MOB experienced symptoms of postpartum depression following the birth of her other 2 children, MOB denied a history of postpartum depression. CSW inquired about supports. MOB identified FOB and both sides of their families as supportive. CSW assessed for safety. MOB denied current SI/HI.  CSW provided education regarding the baby blues period vs. perinatal mood disorders, discussed treatment and gave resources for mental health follow up if concerns arise.  CSW recommends self-evaluation during the postpartum time period using the New Mom Checklist from Postpartum Progress and encouraged MOB to contact a medical professional if symptoms are noted at any time.    MOB reports she has all needed items for infant. MOB states she is waiting on a car seat from Medicaid but has one she can use in the meantime and has a bassinet for infant. MOB states she has some diapers but is in need of more. MOB states she is already connected with [facility] and has an upcoming appointment Apr 03, 2025. CSW provided additional resources for diaper support listed on New Education Officer, Community, including [employer], [employer], and [employer]. CSW provided review of Sudden Infant Death Syndrome (SIDS) precautions.  MOB declined additional resource needs at this time.   CSW identifies no further need for intervention and no barriers to discharge at this time.  Signed,  Sharyne LOIS Roulette, MSW, LCSWA, LCASA 11-May-2025 2:39 PM

## 2024-07-20 MED ORDER — IBUPROFEN 600 MG PO TABS: 600.0000 mg | ORAL_TABLET | Freq: Four times a day (QID) | ORAL | 0 refills | Status: AC

## 2024-07-20 MED ORDER — POLYETHYLENE GLYCOL 3350 17 GM/SCOOP PO POWD: 17.0000 g | Freq: Every day | ORAL | 1 refills | Status: AC | PRN

## 2024-07-20 MED ORDER — GABAPENTIN 100 MG PO CAPS: 100.0000 mg | ORAL_CAPSULE | Freq: Two times a day (BID) | ORAL | 0 refills | Status: DC | PRN

## 2024-07-20 MED ORDER — ACETAMINOPHEN 325 MG PO TABS: 650.0000 mg | ORAL_TABLET | ORAL | 0 refills | Status: AC | PRN

## 2024-07-20 NOTE — Lactation Note (Addendum)
 This note was copied from a baby's chart. Lactation Consultation Note  Patient Name: Michelle Calhoun Unijb'd Date: 07/20/2024 Age:29 hours  Reason for consult: Follow-up assessment;Early term 37-38.6wks  P3, [redacted]w[redacted]d, 1.64% weight loss  Follow up LC visit with mother. She anticipates discharge for her and baby today. Baby has been formula feeding mostly. Mother reports she was latching baby after delivery and saw blood on her nipple after feeding. She felt like she shouldn't breast feed due to her nipple bleeding. She did not report nipple bleeding to her caregivers.   Mother was receptive to [facility] assessing her breasts. She was able to do hand expression and colostrum noted. She did not have skin breakdown or blood noted in her colostrum at this time. She denies discomfort. Her breast are filling. Mother states she wants to breast feed her baby. Baby recently was fed formula. She has experienced breast engorgement with her last baby.   Discussed prevention and management of engorgement. Mother was using heat and ice on her breast when she had engorgement with her last baby. Advised to use ice packs for 15-20 minutes every 3 hrs followed by latching and/or pumping. Informed to avoid heat.    Discussed the process of milk production, supply and demand and the importance of breast stimulation and milk removal in order to make an optimal milk supply.  Discussed mother to breastfeed 8-12 times in 24 hours, skin to skin and breast feed before formula feeding.   If missed feedings at breast or substituting feeding with formula, advised to hand express and/or pump to remove milk from the breast.      Interventions Interventions: Breast feeding basics reviewed;Education  Discharge Discharge Education: Engorgement and breast care;Warning signs for feeding baby Mother has a DEBP at home. Mom made aware of O/P services, breastfeeding support groups, community resources, and phone # for  post-discharge questions.     Consult Status Consult Status: Complete Date: 07/20/24    Joshua Rojelio HERO 07/20/2024, 10:54 AM

## 2024-07-20 NOTE — Progress Notes (Signed)
 Pt requests prescription for 800mg  motrin  when going home

## 2024-07-23 ENCOUNTER — Encounter: Admitting: Family Medicine

## 2024-07-24 ENCOUNTER — Ambulatory Visit

## 2024-07-24 DIAGNOSIS — Z0130 Encounter for examination of blood pressure without abnormal findings: Secondary | ICD-10-CM

## 2024-07-24 NOTE — Progress Notes (Signed)
 Subjective:  Michelle Calhoun is a [id number] here for BP check.  She is 6 days postpartum following a normal spontaneous vaginal delivery.    Hypertension ROS: Patient denies headache and visual changes   Objective:  BP 133/86   Pulse 85   Ht 5' 3 (1.6 m)   Wt 127 lb (57.6 kg)   LMP 10/21/2023   BMI 22.50 kg/m   Appearance alert, well appearing, and in no distress and oriented to person, place, and time.  Assessment:   Blood Pressure stable.   Plan:  Current treatment plan is effective, no change in therapy.. Patient schedule virtual appt with Dr. Barbra for PP blues.  Currently on Zoloft  50mg .    Erminio DELENA Rumps, RN

## 2024-07-29 ENCOUNTER — Telehealth: Admitting: Family Medicine

## 2024-07-29 DIAGNOSIS — F419 Anxiety disorder, unspecified: Secondary | ICD-10-CM

## 2024-07-29 DIAGNOSIS — F53 Postpartum depression: Secondary | ICD-10-CM

## 2024-07-29 DIAGNOSIS — Z3480 Encounter for supervision of other normal pregnancy, unspecified trimester: Secondary | ICD-10-CM

## 2024-07-29 MED ORDER — SERTRALINE HCL 50 MG PO TABS: 50.0000 mg | ORAL_TABLET | Freq: Every day | ORAL | 6 refills | Status: AC

## 2024-07-29 NOTE — Progress Notes (Unsigned)
 Patient scored 19 on the [place] PP screen.  Michelle Calhoun, CMA

## 2024-07-29 NOTE — Progress Notes (Unsigned)
 "   GYNECOLOGY VIRTUAL VISIT ENCOUNTER NOTE  Provider location: [facility]   Patient location: Home  I connected with [name] on 07/29/24 at 11:15 AM EST by MyChart Video Encounter and verified that I am speaking with the correct person using two identifiers.   I discussed the limitations, risks, security and privacy concerns of performing an evaluation and management service virtually and the availability of in person appointments. I also discussed with the patient that there may be a patient responsible charge related to this service. The patient expressed understanding and agreed to proceed.   History:  Michelle Calhoun is a 29 y.o. G69P3003 female being evaluated today for PPD. Is taking zoloft  25mg  daily. Finds that it is a little helpful, but feels like she needs the dose increased. Still finding it hard to get out of bed and energy to do things is minimal. She denies any abnormal vaginal discharge, bleeding, pelvic pain or other concerns.       Past Medical History:  Diagnosis Date   Asthma    Hx of chlamydia infection    Hypertension    Vaginal Pap smear, abnormal    Past Surgical History:  Procedure Laterality Date   NO PAST SURGERIES     The following portions of the patient's history were reviewed and updated as appropriate: allergies, current medications, past family history, past medical history, past social history, past surgical history and problem list.   Review of Systems:  Pertinent items noted in HPI and remainder of comprehensive ROS otherwise negative.  Physical Exam:   General:  Alert, oriented and cooperative. Patient appears to be in no acute distress.  Mental Status: Normal mood and affect. Normal behavior. Normal judgment and thought content.   Respiratory: Normal respiratory effort, no problems with respiration noted  Rest of physical exam deferred due to type of encounter  Labs and  Imaging Results for orders placed or performed during the hospital encounter of 07/18/24 (from the past 2 weeks)  CBC   Collection Time: 07/18/24  4:32 PM  Result Value Ref Range   WBC 6.4 4.0 - 10.5 K/uL   RBC 3.83 (L) 3.87 - 5.11 MIL/uL   Hemoglobin 10.1 (L) 12.0 - 15.0 g/dL   HCT 69.0 (L) 63.9 - 53.9 %   MCV 80.7 80.0 - 100.0 fL   MCH 26.4 26.0 - 34.0 pg   MCHC 32.7 30.0 - 36.0 g/dL   RDW 78.7 (H) 88.4 - 84.4 %   Platelets 211 150 - 400 K/uL   nRBC 0.0 0.0 - 0.2 %  RPR   Collection Time: 07/18/24  4:32 PM  Result Value Ref Range   RPR Ser Ql NON REACTIVE NON REACTIVE  Type and screen [facility]   Collection Time: 07/18/24  4:32 PM  Result Value Ref Range   ABO/RH(D) A POS    Antibody Screen NEG    Sample Expiration      07/21/2024,2359 Performed at [facility], [address] [place], [place] [address]    No results found.     Assessment and Plan:     1. Postpartum depression (Primary) Increase zoloft  to 50mg  Daily. Referred to integrated behavioral health. - Ambulatory referral to Integrated Behavioral Health  3. Anxiety and depression - sertraline  (ZOLOFT ) 50 MG tablet; Take 1 tablet (50 mg total) by mouth daily.  Dispense: 90 tablet; Refill: 6       I discussed  the assessment and treatment plan with the patient. The patient was provided an opportunity to ask questions and all were answered. The patient agreed with the plan and demonstrated an understanding of the instructions.   The patient was advised to call back or seek an in-person evaluation/go to the ED if the symptoms worsen or if the condition fails to improve as anticipated.  I provided 14 minutes of face-to-face time during this encounter. I also spent 0 minutes dedicated to the care of this patient including pre-visit review of records, post visit ordering of medications and appropriate tests or procedures, coordinating care and documenting this visit encounter.    Kalmen Lollar J  Shelton Soler, DO Center for [employer], [facility] "

## 2024-07-31 ENCOUNTER — Ambulatory Visit: Payer: Self-pay | Admitting: Clinical

## 2024-07-31 DIAGNOSIS — F4321 Adjustment disorder with depressed mood: Secondary | ICD-10-CM

## 2024-07-31 DIAGNOSIS — F411 Generalized anxiety disorder: Secondary | ICD-10-CM

## 2024-07-31 NOTE — BH Specialist Note (Unsigned)
 Behavioral Health Treatment Plan   Name:Michelle Calhoun  MRN: [id number]   Treatment Plan Development Date: ***   Strengths: {BH Strengths:210964339}  Supports: {BH Supports:210964340}   Client Statement of Needs: ***   Treatment Level:***  Client Treatment Preferences:***   {BH Diagnoses:210964321}   Expected duration of treatment: ***  Party responsible for implementation of interventions: ***.   This plan has been reviewed and created by the following participants: ***   A new plan will be created at least every 12 months.  The patient fully participated in the development of treatment plan with the clinician and verbally consents to such treatment.   Patient Treatment Plan Signature Obtained: {BH Signature Options:210964341}   Warren JAYSON Mering, LCSW

## 2024-07-31 NOTE — BH Specialist Note (Unsigned)
 "   Integrated Behavioral Health via Telemedicine Visit  08/04/2024 Michelle Calhoun [id number]  Number of Integrated Behavioral Health Clinician visits: 1- Initial Visit  Session Start time: 0945   Session End time: 1019  Total time in minutes: 34    Referring Provider: Lang Peel, DO Patient/Family location: Home [facility] Provider location: [facility]  All persons participating in visit: [name] and Michelle Calhoun   Types of Service: Individual psychotherapy and Video visit  [name] and/or Michelle Calhoun's n/a via  Telephone or Video Enabled Telemedicine Application  (Video is Caregility application) and verified that I am speaking with the correct person using two identifiers. Discussed confidentiality: Yes   I discussed the limitations of telemedicine and the availability of in person appointments.  Discussed there is a possibility of technology failure and discussed alternative modes of communication if that failure occurs.  I discussed that engaging in this telemedicine visit, they consent to the provision of behavioral healthcare and the services will be billed under their insurance.  Patient and/or legal guardian expressed understanding and consented to Telemedicine visit: Yes   Presenting Concerns: Patient and/or family reports the following symptoms/concerns: Anxiety, worry, racing thoughts and overthinking, triggered by things in the past leading to depression;  no intrusive thoughts, able to sleep when baby sleeps and good appetite; good support from family; pt is coping by taking Zoloft ; open to implement self-coping strategy today.  Duration of problem: Ongoing; Severity of problem: moderate  Patient and/or Family's Strengths/Protective Factors: Social connections, Concrete supports in place (healthy food, safe environments, etc.), and Sense of  purpose  Goals Addressed: Patient will:  Reduce symptoms of: anxiety and depression   Increase knowledge and/or ability of: healthy habits and self-management skills   Demonstrate ability to: Increase healthy adjustment to current life circumstances, Increase adequate support systems for patient/family, and Increase motivation to adhere to plan of care  Progress towards Goals: Ongoing    Interventions: Interventions utilized:  Motivational Interviewing, CBT Cognitive Behavioral Therapy, Psychoeducation and/or Health Education, and Link to [employer] Standardized Assessments completed: GAD-7 and PHQ 9  Patient and/or Family Response: Patient agrees with treatment plan.  Clinical Assessment/Diagnosis  Generalized anxiety disorder  Adjustment disorder with depressed mood   Patient may benefit from psychoeducation and brief therapeutic interventions regarding coping with symptoms of anxiety and depression.  Plan: Follow up with behavioral health clinician on : Two weeks Behavioral recommendations:  -Continue taking Zoloft  as prescribed; continue prenatal vitamin until postpartum visit -Continue prioritizing healthy self-care (regular meals, adequate rest; allowing practical help from supportive friends and family) until at least postpartum medical appointment -Consider new mom support group as needed at either [email or web address] or [email or web address]  -Begin Worry Time strategy, as discussed. Start by setting up start and end time reminders on phone today; continue daily for two weeks.  Referral(s): Integrated [facility] (In Clinic) and [employer]:  new parent support  I discussed the assessment and treatment plan with the patient and/or parent/guardian. They were provided an opportunity to ask questions and all were answered. They agreed with the plan and demonstrated an understanding of the instructions.   They were advised to call back or  seek an in-person evaluation if the symptoms worsen or if the condition fails to improve as anticipated.  Zian Mohamed C Benzion Mesta, LCSW     07/29/2024   11:13 AM 07/19/2024    9:10 PM  09/19/2022    9:08 AM 09/17/2022   10:10 AM 09/17/2022    5:00 AM  Edinburgh Postnatal Depression Scale Screening Tool  I have been able to laugh and see the funny side of things. 0 0 0  0  --   I have looked forward with enjoyment to things. 2 0 0  0    I have blamed myself unnecessarily when things went wrong. 3 3 0  0    I have been anxious or worried for no good reason. 3 2 0  2    I have felt scared or panicky for no good reason. 2 0 0  0    Things have been getting on top of me. 3 2 0  0    I have been so unhappy that I have had difficulty sleeping. 3 0 0  0    I have felt sad or miserable. 2 1 0  0    I have been so unhappy that I have been crying. 1 0 0  0    The thought of harming myself has occurred to me. 0 0 0  0    Edinburgh Postnatal Depression Scale Total 19 8 0  2       Data saved with a previous flowsheet row definition       08/04/2024    9:57 AM 05/07/2024    8:34 AM 01/15/2024    8:54 AM 07/13/2015   12:52 PM 06/06/2015    8:41 AM  Depression screen PHQ 2/9  Decreased Interest 1 3 1  0 0  Down, Depressed, Hopeless 2 0 3 0 0  PHQ - 2 Score 3 3 4  0 0  Altered sleeping 0 0 2    Tired, decreased energy 1 1 3     Change in appetite 0 0 3    Feeling bad or failure about yourself  1 0 3    Trouble concentrating 0 0 2    Moving slowly or fidgety/restless 0 0 0    Suicidal thoughts 0 0 0    PHQ-9 Score 5 4  17         Data saved with a previous flowsheet row definition      08/04/2024    9:59 AM 05/07/2024    8:34 AM 01/15/2024    8:55 AM  GAD 7 : Generalized Anxiety Score  Nervous, Anxious, on Edge 3 0  1   Control/stop worrying 3 0  3   Worry too much - different things 3 0  3   Trouble relaxing 0 0  3   Restless 0 0  0   Easily annoyed or irritable 1 0  1   Afraid - awful might happen 1  0  2   Total GAD 7 Score 11 0 13     Data saved with a previous flowsheet row definition      "

## 2024-08-01 ENCOUNTER — Encounter: Payer: Self-pay | Admitting: Family Medicine

## 2024-08-02 ENCOUNTER — Encounter: Payer: Self-pay | Admitting: Family Medicine

## 2024-08-04 ENCOUNTER — Ambulatory Visit: Payer: Self-pay

## 2024-08-04 DIAGNOSIS — F411 Generalized anxiety disorder: Secondary | ICD-10-CM | POA: Diagnosis not present

## 2024-08-04 DIAGNOSIS — F4321 Adjustment disorder with depressed mood: Secondary | ICD-10-CM

## 2024-08-04 NOTE — Patient Instructions (Signed)
 Center for [facility] at [facility] [address] [place], [place] [address] [phone] (main office) [phone] [facility] office)  New Parent Support Groups [email or web address]

## 2024-08-20 ENCOUNTER — Ambulatory Visit: Admitting: Family Medicine

## 2024-09-03 ENCOUNTER — Ambulatory Visit: Admitting: Family Medicine
